# Patient Record
Sex: Male | Born: 1970 | Race: White | Hispanic: No | Marital: Single
Health system: Midwestern US, Community
[De-identification: ages and names within clinical notes are randomized; demographics above are authoritative.]

## PROBLEM LIST (undated history)

## (undated) DIAGNOSIS — K5190 Ulcerative colitis, unspecified, without complications: Secondary | ICD-10-CM

## (undated) HISTORY — PX: KNEE SURGERY: SHX244

## (undated) HISTORY — PX: BACK SURGERY: SHX140

## (undated) HISTORY — PX: ANKLE SURGERY: SHX546

## (undated) HISTORY — PX: PROSTATE SURGERY: SHX751

## (undated) HISTORY — PX: HERNIA REPAIR: SHX51

---

## 1999-04-27 NOTE — ED Provider Notes (Signed)
[facility]                      EMERGENCY DEPARTMENT TREATMENT REPORT   NAME:  [name], [name]   MR #:  [id number]   BILLING #: [id number]        DOS: 04/27/1999  TIME: 3:02 A   cc:   Primary Physician:  Gershon Mussel, M.D., Teresa Coombs   The patient was seen at 0250 hours.   CHIEF COMPLAINT:   Back pain.   HISTORY OF PRESENT ILLNESS:  The patient presents to the Emergency   Department stating that this past evening he was picking up a heavy duffel   bag when he injured his low back.  The patient is complaining of pain and   spasm in the low back.  The patient states the pain is radiating down the   right leg.  The patient also states that he is currently being followed by   Dr. Carlena Sax down at the [facility] in reference to his back pain.  The   patient was supposed to follow-up with [facility] and has yet   to do so.  The patient states he is possibly scheduled for epidural steroid   injections, but has not stated when exactly.  The patient denies any bowel   or bladder complaints.  The patient denies any other injuries or   complaints.    The patient states he has a herniated disk in his low back.   PAST MEDICAL HISTORY:   Pertinent for he states a herniated disk in his low   back, unsure of the level.  The patient has chronic low back pain, as well.   MEDICATIONS:  None.   ALLERGIES:  None.   SOCIAL HISTORY:   The patient smokes cigarettes, admits to ETOH use, denies   any ETOH this evening, denies any illicit drug use.   PHYSICAL EXAMINATION:   GENERAL:  The patient is a 28 year old white male, well-developed,   well-nourished, conscious, alert and oriented.  No respiratory distress.   VITAL SIGNS:  Blood pressure 153/71, pulse 106, respirations 18 and   temperature 97.4.   SKIN:  Warm and dry.  No lesions.   BACK:  Completely nontender to palpation.  There are no spasms palpable.   The patient, however, is slow to move and has guarded movements secondary    to his pain.  The patient has negative straight leg raising bilaterally.   EXTREMITIES:  Full range of motion, normal strength of all extremities.   Lower extremities have normal deep tendon reflexes bilaterally.   COURSE IN THE EMERGENCY DEPARTMENT:   The patient was seen and examined by   myself and Dr. Willette Pa.  The patient's condition remained stable   throughout his stay in the Emergency Department and had no other complaints   while in the Emergency Department.   FINAL DIAGNOSIS:  Recurrent low back pain.   DISPOSITION:  The patient was discharged home in stable condition with low   back pain after care instruction sheet.  The patient is given Vicodin ES,   #8 tablets only, with no refills and the patient understands he must see   Dr. Carlena Sax for any further pain medications.  He is also advised to use   Advil or Motrin for pain and use ice to the low back.  Return to the   Emergency Department for new or worsening symptoms or any other  concerns.   Electronically Signed By:   Truitt Merle, M.D. 04/30/1999 07:36   ____________________________   Truitt Merle, M.D.   jb D:  04/27/1999 T:  04/29/1999  2:14 A   [id number]   Juanetta Gosling, PA

## 2007-09-28 NOTE — Consults (Signed)
[facility]                               CONSULTATION REPORT                      CONSULTANT:  Rea College, M.D.   NAMERomeo [name], [name]   BILLING #:  [id number]              DATE OF CONSULT:     09/28/2007   MR #:       [id number]               ADM DATE:            09/28/2007   SS #        [id number]            PT. LOCATION:        [id number]               DOB:  [birth year]       AGE:  37             SEX:   M   ATTENDING:  Georgeanna Ayo, M.D.   cc:    Georgeanna Freshour, M.D.          Rea College, M.D.   REASON FOR CONSULTATION:  Right arm abscess.   CONSULTING PHYSICIAN:  Dr. Celine Mans.   HISTORY OF PRESENT ILLNESS:  [name] is a 37 year old gentleman who   presents to the emergency room with a 4-day history of  right arm swelling   and pain.  He denies any local trauma.  He has never had any similar   episodes in the past.  The abscess was drained in the emergency room, where   approximately a milliliter of purulent material was obtained.  He now   reports improvement in the swelling.   PAST MEDICAL HISTORY:  None.   PAST SURGICAL HISTORY:  Inguinal hernia repair and orthopedic procedures.   MEDICATIONS:  None.   ALLERGIES:  None.   PHYSICAL EXAMINATION:   VITAL SIGNS:  Temperature 100.4, vitals otherwise stable.   EXTREMITIES:  Right arm has decreased mobility due to pain.  There is a   2-cm laceration in the anteromedial aspect of the arm with surrounding   induration and erythema.  There is no further drainage.  He denies any   distal paresthesias.   LABORATORY DATA:  WBC 13.6 with a left shift.   RADIOLOGY:  CAT scan of the right upper extremity inflammatory cellulitis   with no organized drainable abscess.   ASSESSMENT:  Right arm abscess status post incision and drainage.  At this   point I would continue IV antibiotics with close monitoring.  It appears to   have improved since the drainage.  If his symptoms progress or his    leukocytosis persists, he may require further drainage in the operating   room.   Electronically Signed By:   Rea College, M.D. 10/06/2007 09:39   ____________________________   Rea College, M.D.   CD  D:  09/28/2007  T:  09/28/2007  6:18 P   [id number]

## 2007-09-28 NOTE — H&P (Signed)
[facility]                              HISTORY AND PHYSICAL                             [name], M.D.   NAMERomeo [name], [name]   MR #:    [id number]                  ADM DATE:          09/28/2007   BILLING  [id number]                 PT. LOCATION       [id number]   #:   SS #     [id number]               DOB:  1970-08-03   AGE:  37   [name], M.D.            SEX:  M   cc:    Mario Adsit, M.D.   HISTORY OF PRESENT ILLNESS:   The patient is a young Caucasian male who was admitted with a history of   pain and swelling in right arm area of 2-3 days.  The patient gives no   history of an injury.  He does not know how it started but it just started   all at one time, progressing to current condition in the ER, they had done   aspiration of an abscess under ultrasound.  CT scan showed no further   abscesses however, the patient's swelling is there.  I started him on   vancomycin and Zosyn pending further culture.   PAST MEDICAL HISTORY:   History of ulcerative colitis with flare-ups off and on.  The patient   seeked attention from the [facility] locally in [place].   SOCIAL HISTORY:   History of smoking.  Denies alcohol abuse and drug abuse.   ALLERGIES:   NO KNOWN DRUG ALLERGIES.   FAMILY HISTORY:   Unremarkable, lives with the family.   REVIEW OF SYSTEMS:   GENERAL:  Some history of low grade fever, no chills, no rigors.   HEAD:  No history of headache, head injury.   EYES:  No history of visual disturbances.   ENT:  No history of URI symptoms.   RESPIRATORY:  History of cough expectoration and wheezing for 2 days.   CVS:  There is no chest pain, palpitation.   GI:  No history of nausea, vomiting, diarrhea.   MUSCULOSKELETAL:  As mentioned in H&amp;P.   PHYSICAL EXAMINATION:   GENERAL:  Quite average-built male, alert, awake, oriented.   VITAL SIGNS:   Blood pressure 130/80, pulse 80 per minute, respiratory   20-22 per minute.    HEENT:  Head atraumatic, normocephalic.  Eyes clear bilaterally.   NECK:   No JVD, no thyromegaly.   CHEST:  Normal shape, bilaterally air entry present.  Bilateral extensive   rhonchi.   CVS:  S1-S2 regular.   ABDOMEN:  Benign.  Bowel sounds present.  No guarding or rigidity.   RECTAL:  Stool deferred.   LABORATORY DATA:   Sodium 140, potassium 4.3, chloride 103, bicarb 28, glucose 96, BUN 10,   creatinine 0.9, WBC 13.6, hemoglobin is 15.4, hematocrit 55.2.   IMPRESSION:  1. Right upper arm cellulitis.   2. Abscess right upper arm.   3. Chronic obstructive pulmonary disease versus acute bronchitis, acute.   4. Ulcerative colitis.   PLAN:   1. Right arm abscess formation - it could be an embolic abscess from his      lung.  I will get chest x-ray. The patient is having increasing cough      expectoration.  Meanwhile, continue broad-spectrum antibiotic. Repeat      MRI for further definition of any smaller abscesses, surgical      consultation.   2. Chronic obstructive pulmonary disease, acute, most likely secondary to      smoking.  It could be bronchitis acute versus pneumonia, pending chest      x-ray.  Depending on his condition, further treatment will change.   Electronically Signed By:   Mario Olvey, M.D. 10/24/2007 06:37   ____________________________   Mario Galeana, M.D.   AK  D:  09/28/2007  T:  09/28/2007 10:27 P   [id number]

## 2007-09-28 NOTE — ED Provider Notes (Signed)
[facility]                      EMERGENCY DEPARTMENT TREATMENT REPORT   NAME:  [name], [name], [name]  PT. LOCATION:      [id number]          DOB:                                                                         AGE:   MR #:      BILLING #:           DOA:  09/28/2007   DOD:              SEX:  M   [id number]   [id number]   cc:   CHIEF COMPLAINT:  Right swollen arm, possible bite.   HISTORY OF PRESENT ILLNESS:  This is a 37 year old male who presents with a   2-day history of pain and aching in his right biceps area.  He reports that   over the past 24 hours, it has become more edematous, more red, and   painful.  He denies fevers, sweats, or chills but has been nauseated this   morning but no localized abdominal pain.  He states the pain is radiating   from the right biceps area and radiating up into the right axilla, although   he does not complain of having streaking of redness in any direction.   REVIEW OF SYSTEMS:   CONSTITUTIONAL:  As per HPI.   INTEGUMENTARY:  As per HPI.   PAST MEDICAL HISTORY:  Negative.   MEDICATIONS:  None.   ALLERGIES:  None.   PHYSICAL EXAMINATION:   VITAL SIGNS:  Blood pressure is 116/74, pulse 93, respirations 20, and   temperature 99.  Pain 10 out of 10.   GENERAL:  This is a well-developed, well-nourished, well-appearing   37 year old male.   MUSCULOSKELETAL:  Examination of his right biceps area of the right arm   reveals a large area of erythema and soft tissue swelling indicative of   induration.  There are no areas of pointing or fluctuance present.  He is   extremely tender to palpation.  It is not draining from any area.  He has   full range of motion of the right shoulder, elbow, and hand but reports   that it is starting to feel stiff.  He has no lack of sensation but does   have slightly decreased strength .  There is no axillary lymphadenopathy or   cervical lymphadenopathy noted.    INITIAL ASSESSMENT:  After evaluation by Dr. Purnell Shoemaker, we decided   to use an ultrasound to ascertain if there is an abscess formation that we   can incise and drain today.  In the meantime, we will give the patient a   milligram of Dilaudid and 25 mg of Phenergan IM.   PROCEDURE NOTE:  Ultrasound done by Dr. Purnell Shoemaker.  The area of   redness and induration is ultrasounded today by Dr. Purnell Shoemaker, and   the ultrasound reveals a large area of purulent fluid under the   subcutaneous layer of the patient's biceps area of the right upper  extremity.  There is no tracking that we note.  No other abnormalities are   noted on the ultrasound.   SECOND PROCEDURE NOTE:  Incision and drainage done by Chucky May, PAC.   The patient is placed in the supine position.  Exposing the area of the   biceps of redness and induration, we cleansed the area with Betadine x3 and   sterile drapes are applied.  A few milliliters of 1% Xylocaine without   epinephrine is injected into the area of induration.  We inserted an   18-gauge needle into the area of induration to try to aspirate off some   fluid, and my first attempt was unsuccessful.  I then made a 1 cm by 1 cm   crosswise incision, and I am able to drain a small amount of purulent fluid   from the arm.  I used my blade to go down to the very base of the blade,   and I am still unable to extract any purulent fluid.  Dr. Purnell Shoemaker   inserts an 18-gauge needle into the area of induration and is able to also   extract a small amount of purulent fluid.  A 2.5 cm depth is where we are   able to extract the purulent drainage, and we are not able to aspirate more   than 1-2 mL.  The wound is then cleaned and packed with iodoform packing.   A gauze dressing is placed with Kerlix.   CONTINUATION BY:  NICOLE ARTHUR   COURSE IN THE ED:  After further evaluation by Dr. Laural Benes, we decided that   we will consult with general surgery for further evaluation of this    abscess.  It does appear to be significantly deep to the subcutaneous   tissue.  We will start him on IV antibiotics and IV pain medicine, and we   will call for admission to a regular bed and coverage by Dr. Celine Mans on call   for medicine today.  He is given another milligram of Dilaudid.  He is   given 1.5 g of vancomycin IV.   After some time, he still has 10/10 pain,   which he states is intractable.  So we administered 2 mg of IV morphine and   over the course of time, I have had to give him another 2 mg.  We obtained   a wound culture from the aspirate that Dr. Laural Benes was able to get.  We   then obtained a CBC and BMP.   Consultation with the general surgeon on   call reveals a request for a CT of the arm which is ordered.  Admission   will be completed by Dr. Laural Benes.   DISPOSITION:  We discharged the patient to admission to a regular bed under   the care of Dr. Celine Mans and the general surgeon on call for further   evaluation and management of this deep abscess.   FINAL DIAGNOSIS:   1. Primary deep abscess right bicep.   2. Cellulitis arm any part above the wrist.   Electronically Signed By:   Carlos American, M.D. 10/04/2007 11:26   ____________________________   Carlos American, M.D.   My signature above authenticates this document and my orders, the final   diagnosis(es), discharge prescription(s) and instructions in the [facility] record.   ST  D:  09/28/2007  T:  09/28/2007  4:46 P   [id number]   st  D:  09/28/2007  T:  09/29/2007 12:39 A   [id number]   Roxan Diesel, PA

## 2007-10-01 NOTE — Op Note (Signed)
[facility]                                OPERATION REPORT                        SURGEON:  JABULANI MUNALULA, M.D.   [name], [name]   E:   MR  [id number]                         DATE:            09/30/2007   #:   SS  [id number]                      PT. LOCATION:    [id number]   #   JABULANI MUNALULA, M.D.   cc:    JABULANI MUNALULA, M.D.   PREOPERATIVE DIAGNOSIS:   Right arm abscess.   POSTOPERATIVE DIAGNOSIS:   Right arm abscess.   PROCEDURE:   Incision and drainage of right arm abscess.   ANESTHESIA:   General.   ESTIMATED BLOOD LOSS:   None.   SURGEON:   J. Munalula, M.D.   URINE OUTPUT:   None.   PROCEDURE:  The patient was brought to the OR and placed on the table in a   supine position with his right arm extended at his side.  A general   anesthesia was induced.  His arm was prepped and draped.  I re-incised the   previous skin incision along the anterior aspect of the arm.  The incision   was vertical with a cruciate extension.  I obtained 5-10 cc of purulent   discharge.  I irrigated the wound copiously, debriding the nonviable tissue   from the abscess edges.  The cavity extended superiorly approximately 3 cm   and distally for a short distance.  Hemostasis was insured.  The wound was   packed with 1/4 inch Iodoform gauze with 4 x 4's and a Kerlix wrap.  The   patient was aroused and transferred to the recovery room in stable   condition.   Electronically Signed By:   JABULANI MUNALULA, M.D. 10/06/2007 09:39   ____________________________   JABULANI MUNALULA, M.D.   ECC  D:  10/01/2007  T:  10/02/2007 12:53 P   [id number]

## 2007-10-01 NOTE — Op Note (Signed)
[facility]                                OPERATION REPORT                        SURGEON:  Rea College, M.D.   [facility], [place]   E:   MR  [id number]                         DATE:            09/30/2007   #:   Lindley Magnus  [id number]                      PT. LOCATION:    [id number]   #   Rea College, M.D.   cc:    Rea College, M.D.   PREOPERATIVE DIAGNOSIS:   Right arm abscess.   POSTOPERATIVE DIAGNOSIS:   Right arm abscess.   PROCEDURE:   Incision and drainage of right arm abscess.   ANESTHESIA:   General.   ESTIMATED BLOOD LOSS:   None.   SURGEON:   Reyne Dumas, M.D.   URINE OUTPUT:   None.   PROCEDURE:  The patient was brought to the OR and placed on the table in a   supine position with his right arm extended at his side.  A general   anesthesia was induced.  His arm was prepped and draped.  I re-incised the   previous skin incision along the anterior aspect of the arm.  The incision   was vertical with a cruciate extension.  I obtained 5-10 cc of purulent   discharge.  I irrigated the wound copiously, debriding the nonviable tissue   from the abscess edges.  The cavity extended superiorly approximately 3 cm   and distally for a short distance.  Hemostasis was insured.  The wound was   packed with 1/4 inch Iodoform gauze with 4 x 4's and a Kerlix wrap.  The   patient was aroused and transferred to the recovery room in stable   condition.   Electronically Signed By:   Rea College, M.D. 10/06/2007 09:39   ____________________________   Rea College, M.D.   Seleta Rhymes  D:  10/01/2007  T:  10/02/2007 12:53 P   [id number]

## 2007-10-02 NOTE — Discharge Summary (Signed)
[facility]                                DISCHARGE SUMMARY                             [name], M.D.   NAMERomeo [name], [name], [name]   MR #:  [id number]                      ADM DATE:   09/28/2007   [name] [id number]                     DIS DATE:     10/02/2007   G#   SS #   [id number]                   DOB:  10-03-1970   [name], M.D.              AGE:37                   SEX:  M   cc:    [name], M.D.   HISTORY OF PRESENT ILLNESS AND HOSPITALIZATION:  This 37 year old Caucasian   male admitted to the emergency room at [facility] with a   history of right arm swelling pain and increased temperature.  Initially,   the ER physician tried to aspirate some pus, but later on MRI suggested   residual pus.  Dr. Adriana Reams, surgeon, did the surgery, I&amp;D.  Now, the   patient's pain is much better, still requiring some analgesic.  The patient   was changed to clindamycin p.o. for antibiotic coverage.  The wound grew   Streptococcus viridans but not very ______ is not available at this point.   The patient also has a history of smoking and he has some wheezing   suggestive of COPD.  I am adding Combivent inhaler p.[name].n.   DISCHARGE DIAGNOSES:   1.     Combivent 2 puff q.4 hours p.[name].n. for shortness of breath.   2.     Clindamycin 600 mg p.o. q.6 hours.   3.     Vicodin 5/500 p.o. q.6 hours for pain.   Follow up with Dr. Adriana Reams in 1 to 2 weeks or according to his schedule.   DIET:  Low-salt, low-cholesterol regular diet.   Strongly recommended to stop smoking.   Follow up with his primary care doctor in 2 weeks.   DISCHARGE DIAGNOSES:   1.     Abscess, right arm.   2.     Chronic obstructive pulmonary disease, acute.   3.     Tobacco abuse.   Electronically Signed By:   [name], M.D. 10/24/2007 06:37   ____________________________   [name], M.D.   TS1  D:  10/02/2007  T:  10/03/2007 12:58 P   [id number]

## 2007-10-04 NOTE — ED Provider Notes (Signed)
[facility]                      EMERGENCY DEPARTMENT TREATMENT REPORT   NAME:  Costilla, Dodge R, III  PT. LOCATION:      ER  [id number]          DOB:                                                                         AGE:   MR #:      BILLING #:           DOA:  10/04/2007   DOD:              SEXKara Dies   [id number]   cc:   Primary Physician:  Dr. Mordecai Maes   CHIEF COMPLAINT:   Cellulitis.   HISTORY OF PRESENT ILLNESS:  This is a 36-year gentleman, presenting to the   ER  last Saturday.  He was here for an I&amp;D to his left biceps due to an   abscess.  He actually up getting admitted.  He had to be taken to surgery   by Dr. Adriana Reams for surgical I&amp;D.  He was started on antibiotics and   discharged on Monday night at midnight.  He has been home taking the   antibiotic and using Vicodin.  He was seen in an urgent care facility today   for packing change.  He was told to change the packing every day until   followup with Dr. Adriana Reams.  The patient comes in tonight for significant   right arm pain.  He denies fever or chills.  No nausea or vomiting.   REVIEW OF SYSTEMS:   CONSTITUTIONAL:   Negative fever and chills.   GI:  Negative nausea and vomiting.   MUSCULOSKELETAL:  Complaining of right arm pain.   SKIN:  No purulent drainage or redness to the right arm.   Denies complaints in any other system.   PAST MEDICAL HISTORY:  Bronchitis.   MEDICATIONS:  Albuterol.   ALLERGIES:  None.   CURRENT MEDICATIONS:  Vicodin and clindamycin.   SOCIAL HISTORY:  The patient smokes approximately two packs of cigarettes a   day.  Denies any alcohol or street drugs.  He is here with wife.   PHYSICAL EXAMINATION:   VITAL SIGNS:  Blood pressure is 116/68, pulse 75, respirations 16.   Temperature is 97.8.  Pain is 10 out of 10.  O2 is 100% on room air.   GENERAL APPEARANCE:  The patient appears well developed and well nourished.    Appearance and behavior are age and situation appropriate.   MUSCULOSKELETAL:  The patient can flex and extend the right upper extremity   without any complications but did have secondary pain.  Radial pulse is   intact at 2+.  Capillary refill is prompt and less than 2 seconds.   SKIN:   There is an ulceration noted to the medial aspect of the right   biceps.  There is no purulent drainage.  There are no signs of cellulitis.   He does have tenderness but it is healing and appears well.  Packing was  removed.  No complications are noted at this time.   NEUROLOGIC:  He is alert and oriented.  He participates well in the exam.   Good eye contact.  Range of motion and strength good.   MUSCULOSKELETAL:  Grip strength is intact.   INITIAL ASSESSMENT AND MANAGEMENT PLAN:   We will go ahead and medicate   this patient for pain at this time.  Will remove packing and replace, but   at this time it appears noninfected.  He is to continue current treatment.   We will change his pain medication for more relief and give him other   instructions for wound care,  but otherwise the right arm is healing and   the surgical abscess incision site appears unremarkable.   EMERGENCY DEPARTMENT PROCEDURE:  The packing was removed.  The area had   1-1/4-inch iodine packing inserted without any complications and a clean   dressing was applied.   CLINICAL IMPRESSION AND DIAGNOSES:  Right arm pain status post surgical   abscess drainage.   DISPOSITION AND PLAN:   Discharged home in stable condition on Percocet   #20.  Increase fluids and rest with activity as tolerated.  Elevated the   right arm above the heart.  Apply moist heat to the area 2 or 3 times a day   for 10-15 minutes.  Percocet for pain, may make drowsy, be careful with   activity.  Return should symptoms worsen.  Motrin 600 mg p.o. t.i.d. with   food for pain and inflammation.  Follow up with surgeon as directed.  Get    packing changed daily as directed.  Continue antibiotics and stop Vicodin.   Dr. Arvella Merles saw this patient and agrees with the above assessment and plan.   Electronically Signed By:   Wetzel Bjornstad Arvella Merles, M.D. 10/05/2007 18:46   ____________________________   Wetzel Bjornstad. Arvella Merles, M.D.   My signature above authenticates this document and my orders, the final   diagnosis(es), discharge prescription(s) and instructions in the [facility] record.   LO  D:  10/05/2007  T:  10/05/2007  7:52 A   [id number]   LINDSAY COBURN, PA-C

## 2007-12-04 NOTE — ED Provider Notes (Signed)
[facility]                      EMERGENCY DEPARTMENT TREATMENT REPORT   NAME:  Feeley, Doak R, III  PT. LOCATION:      ER  ER59          DOB:                                                                         AGE:   MR #:      BILLING #:           DOA:  12/04/2007   DOD:              SEX:  M   36-68-21   [id number]   cc:    DR. Cherylynn Ridges   CHIEF COMPLAINT:  Cellulitis of the left arm.   HISTORY OF PRESENT ILLNESS:  This is a 37 year old male that noticed he had   a cyst on his left 2nd knuckle and he decided to go ahead and take a knife   and try to open it.  Now it is slightly painful and went to the [facility].  They told him he has cellulitis.  Apparently, does not have any   medication for this.  Has no fever.  No chills.  No nausea and no vomiting.   The hand is not swollen or red. He just states he has some pain to his left   hand when he moves his digit, his 2nd index finger, or when he moves his   index finger.  It does not bother him.   REVIEW OF SYSTEMS:   CONSTITUTIONAL: No fever, chills, or weight loss.   EYES: No visual symptoms.   ENT: No sore throat, runny nose, or other URI symptoms.   ENDOCRINE: No diabetic symptoms.   HEMATOLOGIC/LYMPHATIC: No excessive bruising or lymph node swelling.   ALLERGIC/IMMUNOLOGIC: No urticaria or allergy symptoms.   PAST MEDICAL HISTORY:  Asthma.   FAMILY HISTORY:  Negative.   SOCIAL HISTORY:  Negative.   MEDICATIONS:  Albuterol.   ALLERGIES:  None.   PHYSICAL EXAMINATION:   VITAL SIGNS:  Blood pressure 119/73, pulse 84, respirations 14, temperature   98.4.  On 0-10 pain scale, 10/10.  Saturation 98%.   GENERAL APPEARANCE:  Patient appears well developed and well nourished.   Appearance and behavior are age and situation appropriate.   EXTREMITIES:  Examination of the patient's left hand, he has a 1 cm   incision to his 2nd digit at the PIP joint.  He has no tenosynovitis.  He    has no redness or erythema.  Capillary refill is less than 3 seconds.  The   area to that he tried to excise appears to be a possible ganglion cyst or   an epidermal cyst.   COURSE IN THE EMERGENCY DEPARTMENT:  two Vicodin for the pain.  We will   treat him as an outpatient with Bactrim and Keflex.   CONTINUATION BY:  NELSON SANTIAGO   IMPRESSION:  We will place the patient on Keflex and Bactrim.   FINAL DIAGNOSIS:  Early cellulitis of the left hand.  DISPOSITION:  The patient is discharged with verbal and written   instructions and a referral for ongoing care.  The patient is aware that   they may return at any time for new or worsening symptoms.  Condition   stable.  Disposition is home.  Was sent home on pain medications as well.   Warm compresses.   Electronically Signed By:   Erlinda Hong, M.D. 12/07/2007 09:11   ____________________________   TODD Wilfrid Lund, M.D.   Dictated by:  Hilaria Ota, PA-C   My signature above authenticates this document and my orders, the final   diagnosis(es), discharge prescription(s) and instructions in the [facility] record.   ST  D:  12/04/2007  T:  12/06/2007 12:06 A   [id number]   st  D:  12/04/2007  T:  12/06/2007  7:28 P   [id number]

## 2008-03-15 NOTE — ED Provider Notes (Signed)
[facility]                      EMERGENCY DEPARTMENT TREATMENT REPORT   NAME:  [name], [name], [name]   PT. LOCATION:    ER  ER12       DOB:  [birth year]                                                                     AGE:  37   MR #:       BILLING #:           DOS: 03/15/2008  TIME: 7:05 P   SEX:  M   36-68-21    [id number]   cc:   Primary Physician:   CHIEF COMPLAINT:  Jaw pain.   HISTORY OF PRESENT ILLNESS:  This 37 year old male presents with jaw pain.   States he had a tooth extracted 2 weeks ago, and he has been having pain.   He saw the dentist 11 days ago and was placed on clindamycin and Percocet   and then saw him again yesterday; and he was told that he is waiting to see   if the skin heals up over his jaw and if not, that he would need surgery   next week; but no change was done in his management.  He came in today   because he is hurting a lot.   REVIEW OF SYSTEMS:   CONSTITUTIONAL:  No fever.   ENT:  Positive jaw pain.   PAST MEDICAL HISTORY:  Otherwise negative.   SOCIAL HISTORY:  Noncontributory.   PHYSICAL EXAMINATION:   VITAL SIGNS:  Blood pressure 133/79, pulse 98, respirations 14, and   temperature 99.8.   GENERAL APPEARANCE:  Patient appears well developed and well nourished.   Appearance and behavior are age and situation appropriate.  No facial edema   or erythema noted.  Bone visualized posterior jaw.  No abscess noted.   NECK:  Supple.  No mass palpable.   PROCEDURE NOTE:  Gingival block performed with 2% Xylocaine and 0.5%   bupivacaine with improvement of pain.   FINAL DIAGNOSIS:  Jaw pain.   PLAN:  Patient discharged.  Prescription for Percocet given and Lodine.  He   is to call his dentist tomorrow.  Patient evaluated by myself and Dr.   Henrene Hawking who agrees with the above assessment and plan.   Electronically Signed By:   Haze Justin, M.D. 04/19/2008 09:39   ____________________________   Haze Justin, M.D.   Dictated by:  Dorise Hiss    My signature above authenticates this document and my orders, the final   diagnosis(es), discharge prescription(s) and instructions in the [facility] record.   ST  D:  03/15/2008  T:  03/17/2008  9:16 A   [id number]

## 2008-04-15 LAB — DRUG SCREEN, URINE
ACETAMINOPHEN: NEGATIVE
AMPHETAMINES: NEGATIVE
BARBITURATES: NEGATIVE
BENZODIAZEPINES: POSITIVE — AB
COCAINE: NEGATIVE
METHADONE: NEGATIVE
Methamphetamines: NEGATIVE
OPIATES: POSITIVE — AB
PCP(PHENCYCLIDINE): NEGATIVE
THC (TH-CANNABINOL): NEGATIVE
TRICYCLICS: NEGATIVE

## 2008-04-15 LAB — URINALYSIS W/ RFLX MICROSCOPIC
Bilirubin UA, confirm: NEGATIVE
Bilirubin: NEGATIVE
Blood: NEGATIVE
Glucose: NEGATIVE MG/DL
Leukocyte Esterase: NEGATIVE
Nitrites: NEGATIVE
Specific gravity: 1.02 (ref 1.003–1.030)
Urobilinogen: 0.2 EU/DL (ref 0.2–1.0)
pH (UA): 6 (ref 5.0–8.0)

## 2008-04-15 LAB — URINE MICROSCOPIC ONLY: WBC: 1 /HPF (ref 0–4)
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
## 2008-04-29 NOTE — Procedures (Signed)
Test Reason : PRE OP   Blood Pressure : ***/*** mmHG   Vent. Rate : 076 BPM     Atrial Rate : 076 BPM   P-R Int : 138 ms          QRS Dur : 092 ms   QT Int : 378 ms       P-R-T Axes : 023 095 046 degrees   QTc Int : 425 ms   Normal sinus rhythm   Rightward axis   Incomplete right bundle branch block   Borderline ECG   No previous ECGs available   Confirmed by Robbins, M.D., Joseph ([id number]) on 04/29/2008 3:41:40 PM   Referred By:             Overread By: Joseph Robbins, M.D.

## 2008-04-29 NOTE — Procedures (Signed)
Test Reason : PRE OP   Blood Pressure : ***/*** mmHG   Vent. Rate : 076 BPM     Atrial Rate : 076 BPM   P-R Int : 138 ms          QRS Dur : 092 ms   QT Int : 378 ms       P-R-T Axes : 023 095 046 degrees   QTc Int : 425 ms   Normal sinus rhythm   Rightward axis   Incomplete right bundle branch block   Borderline ECG   No previous ECGs available   Confirmed by Sherral Hammers, M.D., Jomarie Longs [phone]) on 04/29/2008 3:41:40 PM   Referred By:             Overread By: Jani Files, M.D.
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
## 2008-04-30 NOTE — H&P (Signed)
[facility]                              HISTORY AND PHYSICAL                               Jolyn Nap, MD   NAME:    [name], [name]   MR #:    [id number]                  ADM DATE:          05/10/2008   BILLING  [id number]                 PT. LOCATION       [id number]   #:   SS #     [id number]               DOB:  [birth year]   AGE:  37   Mario Trudee Kuster, MD                 SEX:  M   cc:    [name], M.D.          Jolyn Nap, MD   HISTORY   The patient is a 37 year old white male who is 11 days postop from an L5-S1   laminectomy and diskectomy at [facility].  Over the last   week he has been developing progressive pain and then drainage from the   dorsal aspect of his postsurgical wound at the lumbosacral region.  He   denies fevers and chills.  His pain is restricted primarily to his back.   He was invited to return to the outpatient office so his surgeon could   evaluate him, and he failed to do so, but instead went to his local   hospital.  The patient lives at the [address] of [place] and has   had transportation problems getting to the [place] area where   he had his surgery.  The [facility] cultured his   wound either on Tuesday or Wednesday.  We still do not have that culture   result.  The patient then presented to the hospital emergency room at   [facility] on the evening of the 27th of November, and an MRI was   performed.  This demonstrated a small epidural abscess measuring   approximately 1 cm in size, perhaps even slightly smaller.  The patient is   admitted the hospital for treatment of his epidural abscess and his pain   control.   PAST MEDICAL HISTORY AND REVIEW OF SYSTEMS   The patient has an allergy to Mobic.  Otherwise he has no other medical   allergies.  Medical conditions include:   1. Ulcerative colitis.   2. Right upper arm abscess in April of 2009.   3. COPD.    4. [facility] spotted fever in January 2009 treated with two unknown      antibiotics.   5. Jaw surgery in September 2009 in which a tooth was pulled and failed to      heal requiring some type of oral surgery in October 2009 by Dr. Rhys Martini      from the [place] [facility] in order to cover the exposed jaw      bone.  6. Lumbosacral spine surgery on April 30, 2008, L5-S1 laminectomy and      diskectomy.   The patient states that he has not been seen or evaluated for ulcerative   colitis for at least 4 years and that is abdominal symptoms have subsided.   His smoking history - He used to smoke three packs of cigarettes per day.   He began smoking at age 41.  He has cut back to one pack of cigarettes per   day effective about July 2009.  Alcohol intake - Used to be very heavy.  He   claims to have stopped consuming alcohol completely 9 years ago, but began   drinking at about age 76, consuming at least one case of beer per day,   sometimes that plus a fifth of hard alcohol per day.   MEDICATIONS   The patient's COPD has been treated with p.r.n. Combivent.  No other   regular medications.   The patient has been treated with a chronic pain management team, but was   apparently discharged from that management as a result of his narcotic use   during the surgery for his jaw in about October 2009.  He is scheduled to   be reevaluated by a new pain management team under the direction of Dr.   Carolann Littler at the [facility] sometime in last   December 2009.   PHYSICAL EXAMINATION TODAY   The patient is awake, alert, responsive and has appropriate conversation.   NEUROLOGIC:  His neurologic examination of the lower extremities is   normal.   HEAD AND NECK:  Exam is unremarkable.   LUNGS:  Clear to auscultation.   HEART:  Regular rate rhythm with no audible murmurs or gallops.   ABDOMEN:  Benign.   LOWER EXTREMITIES:  His neurosurgical examination is intact.  At the wound    in the lumbosacral spine area posteriorly he has approximately 3 or 4 cm in   length with slight drainage from the most proximal portion of the wound.   There is no erythema in the back.  He has some mild tenderness to palpation   at the left paraspinal area adjacent to the wound.   We reviewed the MRI of the lumbosacral spine done on November 27th, which   was yesterday, and see this very small abscess.   PLAN   1. The patient is admitted to the hospital for a combination of IV      antibiotic treatment and pain management treatment.   2. We have contacted the infectious disease consultant, Dr. Edison Simon,      and discussed a game plan for this patient.  We are attempting to obtain      the cultures from the [facility] both for from his lumbosacral spine      wound culture that was taken in the [facility] earlier this week and a      culture that was obtained from our own emergency room on November 27th.      Furthermore, we will ask the [place] to send [place] any information they      have concerning his culture of the mouth if that was taken during the      September, October time frame as his jaw surgery was being done.  We      reviewed the culture results from his right arm abscess that was      addressed at [facility]  between the 16th and the 10th      of April 2009.  His gram stain showed gram-negative bacilli, moderate,      gram-positive bacilli, a few, and he had grown betahemolytic strep,      group C, strep viridans, modified strep intermedius and moderate      Prevotella species (Bacteroides).   3. We discussed with the radiology department the potential for doing a      radiology-directed aspiration of the abscess.  However, apparently it is      too small really to do an x-ray/CT-directed needle aspiration.      Therefore, we are starting antibiotics as recommended by infectious      disease which will be vancomycin and doripenem.  We will discontinue the      Cleocin for now.    Electronically Signed By:   Jolyn Nap, MD 05/12/2008 17:49   ____________________________   Jolyn Nap, MD   SC  D:  05/11/2008  T:  05/11/2008  1:49 P   [id number]

## 2008-04-30 NOTE — Op Note (Signed)
[facility]                                OPERATION REPORT                          SURGEON:  [name], M.D.   [facility], [place]   E:   MR  [id number]                DATE OF SURGERY:                     04/30/2008   #:   [name]  [id number]             PT. LOCATION:                        [id number]   #   [name], M.D.          DOB: [birth year]        AGE:37        SEX:  M   cc:    [name], M.D.   PREOPERATIVE DIAGNOSIS:   Lumbar disk herniation L5-S1 with stenosis.   POSTOPERATIVE DIAGNOSIS:   Lumbar disk herniation L5-S1 with stenosis.   PROCEDURE PERFORMED:   Lumbar laminectomy and decompression, L5-S1.   SURGEON:   Lyna Poser, M.D.   ASSISTANT:   Lauro Franklin, PA-C   ANESTHESIA:   General endotracheal   BLOOD LOSS:   100 cc   FLUIDS RECEIVED:   Crystalloid   DRAINS:   One suction drain   COMPLICATIONS:   None   HISTORY:   This patient is a 37 year old otherwise healthy gentleman who presented to   me with ongoing complaints of lower back discomfort and sciatica.   Preoperative imaging studies confirmed lumbar pathology at L5-S1.  The   surgical and nonsurgical options were explored.  The nonsurgical options   failed to alleviate his symptoms.  The decision was made to proceed with   operative management.   DESCRIPTION OF PROCEDURE:   On the surgical date, the patient was identified properly in the same day   admission unit area.  The surgical site was identified and marked   appropriately.  The surgical procedure was further discussed with the   patient and the decision made to proceed.  Intravenous antibiotics were   initiated in the holding area.  The patient was brought to the operative   theater.  A general anesthetic was performed by the anesthesiologist.  The   patient was placed prone on the [place] frame.  All bony prominences were   well padded.  The patient's lumbar area was prepped in the usual fashion   and draped appropriately.    A longitudinal incision was made with a sharp blade in the midline at the   level of the lumbar spine.  Using Bovie electrocautery and sharp   dissection, superficial tissues and midline musculature were split   longitudinally and retracted with self-retaining retractors.  Dissection   was carried down to the posterior elements at L5-S1.  Using a Leksell   rongeur, the spinous processes were removed.  Curettes, Leksell rongeurs,   pituitaries, etc. were used to create a full central, lateral recess and   foraminal decompression at L5-S1.  The dura was visualized subsequent to   the  decompression and found be free from further neurologic impingement.  A   Woodson was used to palpate the patency of the neural foramina   bilaterally.   The wound was then irrigated and Gelfoam was placed over the exposed dura.   The wound was closed in layers.  A suction drain was placed.  Running   Monocryl suture was used for the skin.  A sterile dressing was applied and   the patient was subsequently extubated and taken to the recovery room in   stable condition without complications.  All counts were correct.   ____________________________   [name], M.D.   ecc  D:  04/30/2008  T:  05/01/2008  9:24 A  [id number]

## 2008-04-30 NOTE — Op Note (Signed)
[facility]                                OPERATION REPORT                          SURGEON:  [name], M.D.   [facility], [place]   E:   MR  [id number]                DATE OF SURGERY:                     04/30/2008   #:   [name]  [id number]             PT. LOCATION:                        [id number]   #   [name], M.D.          DOB: [birth year]        AGE:37        SEX:  M   cc:    [name], M.D.   PREOPERATIVE DIAGNOSIS:   Lumbar disk herniation L5-S1 with stenosis.   POSTOPERATIVE DIAGNOSIS:   Lumbar disk herniation L5-S1 with stenosis.   PROCEDURE PERFORMED:   Lumbar laminectomy and decompression, L5-S1.   SURGEON:   Lyna Poser, M.D.   ASSISTANT:   Lauro Franklin, PA-C   ANESTHESIA:   General endotracheal   BLOOD LOSS:   100 cc   FLUIDS RECEIVED:   Crystalloid   DRAINS:   One suction drain   COMPLICATIONS:   None   HISTORY:   This patient is a 37 year old otherwise healthy gentleman who presented to   me with ongoing complaints of lower back discomfort and sciatica.   Preoperative imaging studies confirmed lumbar pathology at L5-S1.  The   surgical and nonsurgical options were explored.  The nonsurgical options   failed to alleviate his symptoms.  The decision was made to proceed with   operative management.   DESCRIPTION OF PROCEDURE:   On the surgical date, the patient was identified properly in the same day   admission unit area.  The surgical site was identified and marked   appropriately.  The surgical procedure was further discussed with the   patient and the decision made to proceed.  Intravenous antibiotics were   initiated in the holding area.  The patient was brought to the operative   theater.  A general anesthetic was performed by the anesthesiologist.  The   patient was placed prone on the [place] frame.  All bony prominences were   well padded.  The patient's lumbar area was prepped in the usual fashion   and draped appropriately.    A longitudinal incision was made with a sharp blade in the midline at the   level of the lumbar spine.  Using Bovie electrocautery and sharp   dissection, superficial tissues and midline musculature were split   longitudinally and retracted with self-retaining retractors.  Dissection   was carried down to the posterior elements at L5-S1.  Using a Leksell   rongeur, the spinous processes were removed.  Curettes, Leksell rongeurs,   pituitaries, etc. were used to create a full central, lateral recess and   foraminal decompression at L5-S1.  The dura was visualized subsequent to   the  decompression and found be free from further neurologic impingement.  A   Woodson was used to palpate the patency of the neural foramina   bilaterally.   The wound was then irrigated and Gelfoam was placed over the exposed dura.   The wound was closed in layers.  A suction drain was placed.  Running   Monocryl suture was used for the skin.  A sterile dressing was applied and   the patient was subsequently extubated and taken to the recovery room in   stable condition without complications.  All counts were correct.   ____________________________   [name], M.D.   ecc  D:  04/30/2008  T:  05/01/2008  9:24 A  [id number]

## 2008-04-30 NOTE — Op Note (Signed)
[facility]                                OPERATION REPORT                          SURGEON:  [name], M.D.   [facility], [name]   E:   MR  [id number]                DATE OF SURGERY:                     04/30/2008   #:   [name]  [id number]             PT. LOCATION:                        [id number]   #   [name], M.D.          DOB: 03-Sep-1970        AGE:37        SEX:  M   cc:    [name], M.D.   PREOPERATIVE DIAGNOSIS:   Lumbar disk herniation L5-S1 with stenosis.   POSTOPERATIVE DIAGNOSIS:   Lumbar disk herniation L5-S1 with stenosis.   PROCEDURE PERFORMED:   Lumbar laminectomy and decompression, L5-S1.   SURGEON:   Lyna Poser, M.D.   ASSISTANT:   Lauro Franklin, PA-C   ANESTHESIA:   General endotracheal   BLOOD LOSS:   100 cc   FLUIDS RECEIVED:   Crystalloid   DRAINS:   One suction drain   COMPLICATIONS:   None   HISTORY:   This patient is a 37 year old otherwise healthy gentleman who presented to   me with ongoing complaints of lower back discomfort and sciatica.   Preoperative imaging studies confirmed lumbar pathology at L5-S1.  The   surgical and nonsurgical options were explored.  The nonsurgical options   failed to alleviate his symptoms.  The decision was made to proceed with   operative management.   DESCRIPTION OF PROCEDURE:   On the surgical date, the patient was identified properly in the same day   admission unit area.  The surgical site was identified and marked   appropriately.  The surgical procedure was further discussed with the   patient and the decision made to proceed.  Intravenous antibiotics were   initiated in the holding area.  The patient was brought to the operative   theater.  A general anesthetic was performed by the anesthesiologist.  The   patient was placed prone on the [place] frame.  All bony prominences were   well padded.  The patient's lumbar area was prepped in the usual fashion   and draped appropriately.   A longitudinal incision was made with a  sharp blade in the midline at the   level of the lumbar spine.  Using Bovie electrocautery and sharp   dissection, superficial tissues and midline musculature were split   longitudinally and retracted with self-retaining retractors.  Dissection   was carried down to the posterior elements at L5-S1.  Using a Leksell   rongeur, the spinous processes were removed.  Curettes, Leksell rongeurs,   pituitaries, etc. were used to create a full central, lateral recess and   foraminal decompression at L5-S1.  The dura was visualized subsequent to   the  decompression and found be free from further neurologic impingement.  A   Woodson was used to palpate the patency of the neural foramina   bilaterally.   The wound was then irrigated and Gelfoam was placed over the exposed dura.   The wound was closed in layers.  A suction drain was placed.  Running   Monocryl suture was used for the skin.  A sterile dressing was applied and   the patient was subsequently extubated and taken to the recovery room in   stable condition without complications.  All counts were correct.   ____________________________   [name], M.D.   ecc  D:  04/30/2008  T:  05/01/2008  9:24 A  [id number]

## 2008-04-30 NOTE — Op Note (Signed)
[facility]                                OPERATION REPORT                          SURGEON:  [name], M.D.   [facility], [place]   E:   MR  [id number]                DATE OF SURGERY:                     04/30/2008   #:   Lindley Magnus  [id number]             PT. LOCATION:                        [id number]   #   [name], M.D.          DOB: [birth year]        AGE:37        SEX:  M   cc:    [name], M.D.   PREOPERATIVE DIAGNOSIS:   Lumbar disk herniation L5-S1 with stenosis.   POSTOPERATIVE DIAGNOSIS:   Lumbar disk herniation L5-S1 with stenosis.   PROCEDURE PERFORMED:   Lumbar laminectomy and decompression, L5-S1.   SURGEON:   Lyna Poser, M.D.   ASSISTANT:   Lauro Franklin, PA-C   ANESTHESIA:   General endotracheal   BLOOD LOSS:   100 cc   FLUIDS RECEIVED:   Crystalloid   DRAINS:   One suction drain   COMPLICATIONS:   None   HISTORY:   This patient is a 37 year old otherwise healthy gentleman who presented to   me with ongoing complaints of lower back discomfort and sciatica.   Preoperative imaging studies confirmed lumbar pathology at L5-S1.  The   surgical and nonsurgical options were explored.  The nonsurgical options   failed to alleviate his symptoms.  The decision was made to proceed with   operative management.   DESCRIPTION OF PROCEDURE:   On the surgical date, the patient was identified properly in the same day   admission unit area.  The surgical site was identified and marked   appropriately.  The surgical procedure was further discussed with the   patient and the decision made to proceed.  Intravenous antibiotics were   initiated in the holding area.  The patient was brought to the operative   theater.  A general anesthetic was performed by the anesthesiologist.  The   patient was placed prone on the [name] frame.  All bony prominences were   well padded.  The patient's lumbar area was prepped in the usual fashion   and draped appropriately.   A longitudinal incision was made with a  sharp blade in the midline at the   level of the lumbar spine.  Using Bovie electrocautery and sharp   dissection, superficial tissues and midline musculature were split   longitudinally and retracted with self-retaining retractors.  Dissection   was carried down to the posterior elements at L5-S1.  Using a Leksell   rongeur, the spinous processes were removed.  Curettes, Leksell rongeurs,   pituitaries, etc. were used to create a full central, lateral recess and   foraminal decompression at L5-S1.  The dura was visualized subsequent to   the  decompression and found be free from further neurologic impingement.  A   Woodson was used to palpate the patency of the neural foramina   bilaterally.   The wound was then irrigated and Gelfoam was placed over the exposed dura.   The wound was closed in layers.  A suction drain was placed.  Running   Monocryl suture was used for the skin.  A sterile dressing was applied and   the patient was subsequently extubated and taken to the recovery room in   stable condition without complications.  All counts were correct.   ____________________________   [name], M.D.   ecc  D:  04/30/2008  T:  05/01/2008  9:24 A  [id number]

## 2008-05-03 NOTE — ED Provider Notes (Signed)
[facility]                      EMERGENCY DEPARTMENT TREATMENT REPORT   NAME:  [name], [name], [name]   PT. LOCATION:    ER  ER30       DOB:  [birth year]                                                                     AGE:  37   MR #:       BILLING #:           DOS: 05/03/2008  TIME: 2:29 P   SEX:  M   [id number]    [id number]   cc:   Primary Physician:   CHIEF COMPLAINTBack pain.   HISTORY OF PRESENT ILLNESS   This is a 37 year old male who is postop day 3 from lumbar laminectomy and   decompression L5-S1.  He  complains of pain.  He states that he has had   pain since the operation.  He has not really gotten any worse but he has   been unable to control it at home.  He is on Percocet and ibuprofen and   feels as though the pain is not getting any better.  He was seen by Dr.   Renaldo Aldridge who referred him to pain management.  He was unable to get in and   therefore he presents here.   REVIEW OF SYSTEMS   CONSTITUTIONAL:  No fever, chills, weight loss.  His significant other with   him reports that he was diaphoretic last night, but the patient denies any   fever.   EYES: No visual symptoms.   ENT: No sore throat, runny nose or other URI symptoms.   RESPIRATORY:  No cough, shortness of breath, or wheezing.   CARDIOVASCULAR:  No chest pain, chest pressure, or palpitations.   MUSCULOSKELETAL:  Back pain as per HPI.   NEUROLOGIC:  The patient reports that he occasionally gets numbness when he   is lying on his stomach trying to get comfortable down his lower   extremities, but he states that this resolved with position change.  He   reports that he is able to walk without difficulty and just "tires out   easily."   PAST MEDICAL HISTORY   History of ulcerative colitis.   MEDICATIONS   Percocet.   ALLERGIESNone.   SOCIAL HISTORY   She smokes tobacco.  She denies alcohol or drug abuse.   FAMILY HISTORY   Noncontributory.   PHYSICAL EXAMINATION    VITAL SIGNS:  Temperature is 98.3, blood pressure 119/75, pulse 102,   respiratory rate of 18.   GENERAL:  Well-developed male in distress secondary to pain.   HEENT:  Eyes:  Conjunctivae clear, lids normal.  Pupils equal, symmetrical,   and normally reactive.  Mouth/Throat:  Surface of tongue appears dry.   NECK:  Supple.   LUNGS:  There are some scattered end-expiratory wheezes.   CARDIOVASCULAR:  Heart is tachycardiac and regular.   GASTROINTESTINAL:  Abdomen is soft.   BACK:  There is an incision which is clean, dry and intact.  There is a   very small amount of erythema around  the suture line.  He is mainly   exquisitely tender just left of the incision.  There are no skin changes   here.  There is 5/5 strength in the lower extremities.  Sensation is   intact.   INITIAL ASSESSMENT AND MANAGEMENT PLAN   This is a gentleman with back pain postoperatively.  I will check labs and   discuss with Dr. Renaldo Koehl.  He is neurologically intact at this time.   DIAGNOSTIC STUDIES   CBC is normal except for hemoglobin of 12.6.  BMP is normal except for   glucose of 158.   EMERGENCY DEPARTMENT COURSE   The patient was treated with Dilaudid IV and IV fluids.  I spoke with Dr.   Renaldo Pascuzzi.  There is concern for narcotic abuse with this patient.  However, he   does wish to see the patient today in his clinic.  I discharged him out to   follow up with him this afternoon.  The patient is in agreement with plan.   CLINICAL IMPRESSION/DIAGNOSIS   Postoperative pain.   DISPOSITION   The patient was discharged home in stable condition.  Return for new or   worsening symptoms, difficulty ambulating, numbness or weakness in   extremities, or any other concerns whatsoever.  hat   Electronically Signed By:   Acquanetta Chain, M.D. 05/21/2008 11:32   ____________________________   Acquanetta Chain, M.D.   My signature above authenticates this document and my orders, the final   diagnosis(es), discharge prescription(s) and instructions in the [facility] record.   MW  D:  05/03/2008  T:  05/04/2008 12:03 P   [id number]/

## 2008-05-10 NOTE — ED Provider Notes (Signed)
[name]                      EMERGENCY DEPARTMENT TREATMENT REPORT   NAME:  Burgard, [name]          PT. LOCATION:    ER  ER58       DOB:  [birth year]                                                                     AGE:  37   MR #:       BILLING #:           DOS: 05/10/2008  TIME: 7:48 P   SEX:  M   [id number]    [id number]   cc:   Primary Physician:   CHIEF COMPLAINT:  Drainage, post laminectomy on 04/30/08.   HISTORY OF CHIEF COMPLAINT:  This is a pleasant 37 year old male coming in   with the complaint of drainage at his surgery site.  It has been draining   now for the past 4 days.  It has been hurting.  He is complaining of pain   that is like burning inside of his spine.  He has been to the emergency   room twice now, once here and once at [facility].  He had an x-ray that   was negative and, he says, a culture that was negative.  The patient was   concerned because of the burning sensation, and he brought himself in for   further evaluation and treatment.  He states it is burning in the center   part of his spine, radiating down his right leg.  He denies any loss of   bowel control, urinary incontinence, or loss of ambulation.  He states this   just feels like it is burning with mild amount of oozing that is coming   out.  He was concerned and immediately brought himself in for further   evaluation and treatment.  He denies any fever.  He denies any nausea or   vomiting.  He denies any other complaints.   REVIEW OF SYSTEMS:   CONSTITUTIONAL: No fever, chills, or weight loss.   EYES: No visual symptoms.   RESPIRATORY: No cough, shortness of breath, or wheezing.   CARDIOVASCULAR: No chest pain, chest pressure, or palpitations.   GASTROINTESTINAL: No vomiting, diarrhea, or abdominal pain.   MUSCULOSKELETAL:  Back pain, burning in spine and down right leg.   SKIN:  Dehiscence noted at the top of the surgical site, over the L4-L5   area.  Opening slightly is noted.    PAST MEDICAL HISTORY:  History of recent laminectomy, 04/30/08.  History of   ulcerative colitis.   SOCIAL HISTORY:  He denies alcohol or tobacco use.   ALLERGIES:  Mobic.   PHYSICAL EXAMINATION:   VITAL SIGNS:   BP is 117/71, pulse 89, respirations 14, and temperature   97.3.  Pain is 10/10.   GENERAL APPEARANCE:  Patient appears well developed and well nourished.   Appearance and behavior are age and situation appropriate.   HEENT:  Eyes:  Conjunctivae clear, lids normal.  Pupils equal, symmetrical,   and normally reactive.   NECK:  Supple, nontender, symmetrical, no  masses or JVD, trachea midline.   Thyroid not enlarged, nodular, or tender.   LYMPHATICS:  No cervical or submandibular lymphadenopathy palpated.   RESPIRATORY:  Clear and equal breath sounds.  No respiratory distress,   tachypnea, or accessory muscle use.   HEART:  Regular rate and rhythm.  No murmur.   GI:  Abdomen soft, nontender, without complaint of pain to palpation.  No   hepatomegaly or splenomegaly.  No abdominal or inguinal masses appreciated   by inspection or palpation.   SKIN:  The patient has an incision, approximately 5 cm, with the top   portion of 1.5 cm starting to open up/dehisce.  Cannot appreciate how deep,   but there is a whitening ooze around it.  We will culture and send.  Very   tender.  Erythematous around the site.  No streaking noted, other than 1 cm   outside of the incision.   CONTINUATION BY DR. MANOLIO   DIAGNOSTIC INTERPRETATION   Urinalysis shows specific gravity greater than 1.030 but is otherwise   negative.  CBC and CMP are normal.  MRI of L-spine shows "small epidural   abscess cannot be excluded in the posterior spinal canal at the L5-S1 level   with only mild impression on the thecal sac and maximum dimension 16 mm.   COURSE IN THE EMERGENCY DEPARTMENT   I spoke with Dr. Richardo Priest for Dr. Renaldo Bancroft, who asked me to place the patient on   Cleocin 600 mg IV q.6 hours, a Dilaudid PCA pump, obtain an ID consult in    the morning, and give him an overhead trapeze and sequential compression   stockings.  I have notified the patient of same, he is comfortable with   admission at this point.   DIAGNOSES   1. Small epidural abscess status post laminectomy 04/30/2008.   2. Chronic pain syndrome.   DISPOSITION   The patient admitted in stable condition to the floor.   Electronically Signed By:   Imogene Burn, M.D. 05/17/2008 11:02   ____________________________   Imogene Burn, M.D.   My signature above authenticates this document and my orders, the final   diagnosis(es), discharge prescription(s) and instructions in the [facility] record.   DH  D:  05/10/2008  T:  05/10/2008 10:07 P   [id number]/   st  D:  05/10/2008  T:  05/12/2008  5:37 A   [id number]

## 2008-05-17 NOTE — Discharge Summary (Signed)
[facility]                                DISCHARGE SUMMARY                               [name], M.D.   NAMERomeo [name], [name]   MR #:  [id number]                      ADM DATE:   05/10/2008   [name] [id number]                     DIS DATE:     05/14/2008   G#   SS #   [id number]                   DOB:  [birth year]   [name], M.D.                  AGE:37                   SEX:  M   cc:    [name], M.D.   REASON FOR ADMISSION   Low back pain with question of lumbar infection.   HISTORY AND HOSPITAL COURSE   The patient is a 37 year old gentleman who underwent lumbar laminectomy and   decompression done by myself several weeks ago.  Because of ongoing   discomfort, he was admitted to the hospital for evaluation and management.   Wound cultures confirmed a lumbar wound infection, cultures positive for   Pseudomonas aeruginosa, Enterobacter, etc.   Infectious Disease consultation was obtained and the patient was eventually   placed on Levaquin, doripenem and vancomycin.  A PICC line was placed and a   home antibiotic regimen set up for him.   DISCHARGE INSTRUCTIONS   The patient will continue antibiotics per infectious disease   recommendation.  He will follow up with me in the outpatient clinic in 2   weeks for wound check.   Pain medication will be handled via his pain management physician in [place].  However, as a temporizing measure, a prescription for Dilaudid 8   mg tablets was given to him, to take until he can see his pain management   doctor.   Discharge IV antibiotic prescriptions are via the ID service.   ____________________________   [name], M.D.   le  D:  05/17/2008  T:  05/17/2008  4:26 P   [id number]

## 2008-07-04 NOTE — ED Provider Notes (Signed)
[facility]                      EMERGENCY DEPARTMENT TREATMENT REPORT   NAME:  [name], [name]                  PT. LOCATION:     ER  [id number]   MR #:         BILLING #: [id number]          DOS: 07/03/2008   ZSWF:09:32 A   [id number]   cc:    Cherylynn Ridges, M.D.   PRIMARY CARE PHYSICIAN   Cherylynn Ridges, M.D.   TIME [phone]   CHIEF COMPLAINT   Cellulitis, blood clot.   HISTORY OF PRESENT ILLNESS   A 38 year old male presents to the emergency department for evaluation of a   fever up to 104 last night, pain in his right arm, and a blood clot.  The   patient states that he underwent a laminectomy by Dr. Renaldo Engelstad on 04/30/08.   He was admitted to the hospital on 05/10/08 after he was noted to have   drainage from his surgical site.  He was discharged from the hospital on   12/4.  He had a PICC line placed and was discharged home on antibiotics.   The patient states that he ended developing an infection at that IV site.   He was admitted to [name] in December and states that he was on   various antibiotics.  He also developed a blood clot in his right arm and   was on Lovenox and Coumadin.  Since then, he has been discharged to [name], which is where he lives.  He states that he was having   trouble finding an infectious disease doctor to follow up with.  He did   make an appointment with a pain management doctor on 1/30.  He comes in   tonight because he is concerned about the persistent fever despite being on   p.o. Keflex and clindamycin at this time.  He also states that he is   running low on his Coumadin.  He has not followed up for any recent checks   of his INR.  He states that he has gone to the [name] for   various complaints throughout this time as well.   REVIEW OF SYSTEMS   CONSTITUTIONAL:  Fever by history.   EYES: No visual symptoms.   ENT: No sore throat, runny nose or other URI symptoms.    RESPIRATORY:  Chronic cough secondary to smoking, which he notes to be   unchanged.   CARDIOVASCULAR:  No chest pain, chest pressure, or palpitations.   GASTROINTESTINAL:  No vomiting, diarrhea, or abdominal pain.   GENITOURINARY:  No dysuria, frequency, or urgency.   MUSCULOSKELETAL:  Right arm pain, back pain.   INTEGUMENTARY:  No rashes.   NEUROLOGICAL:  No headaches, sensory or motor symptoms.   PSYCHIATRIC:  No suicidal or homicidal ideation.   PAST MEDICAL HISTORY   Includes laminectomy by Dr. Renaldo Escalera in November 2009, ulcerative colitis,   right upper arm abscess in April 2009, COPD, [name] spotted fever   in January 2009, treated with 2 antibiotics at that time, jaw surgery.   ALLERGIES   Mobic and vancomycin.   MEDICATIONS   Reviewed in [facility].   PHYSICAL EXAMINATION   VITAL SIGNS:  Blood pressure 133/86, pulse 106, respiratory rate 18,  temperature 98.4, O2 saturation is 99% on room air, pain 7/10.   GENERAL APPEARANCE:  The patient appears well developed and well nourished.   Appearance and behavior are age and situation appropriate.   HEENT:  Eyes:  Conjunctivae clear, lids normal.  Pupils equal, symmetrical,   and normally reactive.  Mouth/Throat:  Surfaces of the pharynx, palate, and   tongue are pink, moist, and without lesions.   NECK:  Supple and symmetrical.  Trachea midline.   LYMPHATICS:  No cervical or submandibular lymphadenopathy palpated.   LUNGS:  Expiratory wheezing bilaterally.  No tachypnea.  No accessory   muscle use.   HEART:  Regular rate and rhythm.   GI:  Abdomen soft, nontender, without complaint of pain to palpation.  No   hepatomegaly or splenomegaly.   MUSCULOSKELETAL:  Right upper extremity mildly swollen compared to left   upper extremity.  He has a superficial area of erythema over the radial   aspect of the wrist.  Also has a superficial area of erythema over the mid   forearm on the ulnar aspect.  His radial pulse is intact.  He has full    range of motion of the arm.  No streaking.   SKIN:  Warm and dry without rashes.   PSYCHIATRIC:  Recent and remote memory appear to be intact.   NEUROLOGICAL:  No focal deficits.   CONTINUATION BY DR. MANOLIO:   DIAGNOSTIC INTERPRETATION   Urinalysis is negative.  INR is low at 1.5.  BMP and CBC are normal except   for low hemoglobin and hematocrit of 10.3 and 30.7.  Chest x-ray is read   negative acute by radiology.   COURSE IN THE EMERGENCY DEPARTMENT   The patient was a very difficult stick, femoral stick was performed by Salem Caster, PA-C to obtain blood.   I explained to the patient that he should really stay in the hospital so   that we can coordinate infectious disease, orthopedics, and vascular, but   he needs to go back to [name] to make arrangements for his children   and will come back up tomorrow morning to check himself in.  I explained   that he would need to go through the ED again, that I would dictate in his   chart that he would be coming back in the morning.  He is given a dose of   Lovenox here 1.5 mg/kg to hold him over until the morning, also given 8   Percocet for the pain since he is out of those.  He does have Coumadin.   DIAGNOSES   1. Recurrent cellulitis bilateral arms.   2. Deep venous thrombosis right arm.   DISPOSITION   The patient is leaving against medical advice, but promises to come back in   the morning for admission.   CONTINUATION BY Salem Caster, PA-C:   PROCEDURE   Nursing staff was able to get some of the patient's blood work, but we were   not able to collect enough blood for the patient's INR to be run.  The   patient agreed to a femoral stick.  The patient's right groin was cleansed   with Betadine.  Sterile gloves were donned.  He was anesthetized in the   area of the femoral vein with 1% Lidocaine without epinephrine.  A 21-gauge   needle was then inserted at that site and approximately 10 mL of blood was    drawn on first attempt.  The procedure was tolerated well.  The needle was   removed from this site.  Pressure was applied for 2 minutes.  There was no   bleeding after pressure was removed.  A Band-Aid was applied to the site.   The procedure was tolerated well.   Electronically Signed By:   Imogene Burn, M.D. 07/05/2008 13:04   ____________________________   Imogene Burn, M.D.   Dictated by Salem Caster, P.A.   My signature above authenticates this document and my orders, the final   diagnosis(es), discharge prescription(s) and instructions in the [facility] record.   CF  D:  07/04/2008  T:  07/04/2008 12:37 A   [id number]

## 2008-07-04 NOTE — ED Provider Notes (Signed)
[facility]                      EMERGENCY DEPARTMENT TREATMENT REPORT   NAME:  [name]                  PT. LOCATION:     ER  [id number]   MR #:         BILLING #: [id number]          DOS: 07/04/2008   TIME: 6:11 P   62-13-08   cc:    Cherylynn Ridges, M.D.   PRIMARY CARE PHYSICIAN   Cherylynn Ridges, M.D., [facility]   CHIEF COMPLAINT   Return visit for possible admission.   HISTORY OF PRESENT ILLNESS   The patient  is a 38 year old male.  He was seen in this emergency room 1   day prior to arrival today, diagnosed with recurrent cellulitis and DVT in   the right arm. During that visit, it was suggested that he become an   inpatient in order to try and coordinate his care, as these problems   actually have been long-standing.  At that time, he  stated that he was   unable to remain here, so he signed out, saying that he would return. The   ER treatment from last visit states that the patient was given a dose of   Lovenox 1.5 mg/kg here and to hold him until morning and also a   prescription for 8 Percocets.  The patient returns today stating he has   been able to take care of everything he needs to and is here for admission,   if need be.  He has no new complaints.   REVIEW OF SYSTEMS   CONSTITUTIONAL:  No fever, chills, weight loss.   EYES: No visual symptoms.   ENT: No sore throat, runny nose or other URI symptoms.   ENDOCRINE:  No diabetic symptoms.   HEMATOLOGIC/LYMPHATIC:  No excessive bruising or lymph node swelling.   RESPIRATORY:  No cough, shortness of breath, or wheezing.   CARDIOVASCULAR:  As per HPI.   GASTROINTESTINAL:  No vomiting, diarrhea, or abdominal pain.   INTEGUMENTARY  As per HPI.   NEUROLOGICAL:  No headaches, sensory or motor symptoms.   PAST MEDICAL HISTORY   Significant for ulcerative colitis, COPD and [facility] spotted fever.   SURGICAL HISTORY   Orthopedic surgery.   PSYCHIATRIC HISTORY History of anxiety.   SOCIAL HISTORY    Denies alcohol and drug use.  Smokes tobacco.   FAMILY HISTORY   Noncontributory.   ALLERGIES   Mobic, vancomycin.   CURRENT MEDICATIONS   Percocet.   PHYSICAL EXAMINATION   VITAL SIGNS:  Blood pressure 117/74, respirations 20, O2 saturation is 98%   on room air, pulse 88, temperature 98.6, pain 9.   GENERAL APPEARANCE:  The patient appears well developed and well nourished.   Appearance and behavior are age and situation appropriate.   Eyes:  Conjunctivae clear, lids normal.  Pupils equal, symmetrical, and   normally reactive.   Ears/Nose:  Hearing is grossly intact to voice.  Internal and external   examinations of the ears and nose are unremarkable. Mouth/Throat:  Surfaces   of the pharynx, palate, and tongue are pink, moist, and without lesions.   RESPIRATORY:  Clear and equal breath sounds.  No respiratory distress,   tachypnea, or accessory muscle use.   CARDIOVASCULAR:  Heart is regular without  murmurs or rubs.   CHEST:  Chest symmetrical without masses or tenderness.   INTEGUMENTARY:  Exam of the upper extremities bilaterally--there is some   light erythema on the left upper arm with no ecchymosis and no obvious   swelling.  On the right forearm, there is an area that appears to be a   superficial thrombophlebitis.  The patient states that it is his right arm   that has the deep vein thrombosis that extends from the antecubital area up   into his  axillary area.  Examination of the left foot again revealed   light erythema across the dorsum of the foot with some minimal edema that   is nonpitting.  No induration.  No ecchymosis.  Skin is otherwise warm and   dry without rash or injury.   NEUROLOGIC:  Alert, oriented.  Sensation intact, motor strength equal and   symmetric.   CONTINUATION BY Orma Flaming, M.D.   INITIAL ASSESSMENT AND MANAGEMENT PLAN   This is an acute exacerbation of a chronic condition for this patient.   DIAGNOSTIC STUDIES A right upper extremity PVL was obtained which shows a    right upper extremity DVT. In addition, last night, the patient had an INR   checked which was subtherapeutic at 1.5. He also had a CBC done which was   normal.   RE-EVALUATION The patient remained stable while in the emergency   department.  He was given a dose of 1.5 mg/kg of subcutaneous Lovenox while   in the emergency department. I examined him myself. I did not find any   significant cellulitis.  The areas that were apparently infected are not   significantly red, and I find no induration in those areas. The patient   does have intact pulses in his right upper extremity.   CLINICAL IMPRESSION/ DIAGNOSIS   1. Right upper extremity deep venous thrombosis.  The patient is      subtherapeutic on his Coumadin.  His Coumadin dose has been increased at      the suggestion of Dr. Girtha Hake, and also, he will be given a prescription      for Lovenox to use until he is therapeutic.   2. Cellulitis. The patient's cellulitis appears to be resolving. His CBC      last night was normal.  He is afebrile here today, and I do not find any      significant signs of infection.   DISPOSITION AND PLANI spoke with Dr. Girtha Hake who will follow this patient up   as an outpatient.  Dr. Girtha Hake would like him to call in the morning, and   they will set up a way to check his INR.  Also, he is being discharged with   Lovenox to use as directed.  The patient already  knows how to use Lovenox.   In addition, he is being increased to 8 mg a day on Coumadin.  The patient   is not having any chest pain. He is not short of breath and has no vital   signs consistent with pulmonary embolism, so I do not believe he has a PE   at this time.  The patient was advised to return immediately here for   fever, worsening pain or difficulty breathing. Otherwise, he will contact   Dr. Salley Slaughter office first thing in the morning.   Electronically Signed By:   Orma Flaming, M.D. 07/05/2008   13:20   ____________________________   Orma Flaming, M.D.  My signature above authenticates this document and my orders, the final   diagnosis(es), discharge prescription(s) and instructions in the [facility] record.   PB  D:  07/04/2008  T:  07/05/2008  2:31 A   [id number]

## 2008-12-16 NOTE — ED Provider Notes (Signed)
[facility]                      EMERGENCY DEPARTMENT TREATMENT REPORT   NAME:  Gerwig, Jartavious R, III       SEX:            M   DATE:  12/16/2008                     DOB:            June 16, 1970   MR#    [id number]                       TIME SEEN        2:41 A   ACCT#  [id number]                      ROOM:           ER  ER70   cc:   CHIEF COMPLAINT:  Swollen and painful abscess, right arm.   HPI:  This is a 38 year old male who presents with complaints of swelling   to the midaspect of the right forearm.  States that he has pain that is a   5/10, aching intermittent pain that radiates up into the forearm.  The   patient has a history of an abscess that did require incision and drainage,   packing in the past.  He states that this was approximately 2-3 weeks ago,   and this was lower than the current lesion that he has.  It feels as if the   area is moving.  He denies any fever.   REVIEW OF SYSTEMS:   CONSTITUTIONAL:  Denies fever or chills.   MUSCULOSKELETAL:  As above.   INTEGUMENTARY:  As above.   NEUROLOGIC:  Denies paresthesias to the right hand.   PAST MEDICAL HISTORY:  Includes ulcerative colitis, COPD, [place]   spotted fever, right hand surgery, upper arm surgery.   SOCIAL HISTORY:  Positive for tobacco abuse.   CURRENT MEDICATIONS:  Bactrim finished days ago.   ALLERGIES:  Mobic, vancomycin   PHYSICAL EXAMINATION:   VITAL SIGNS:  Blood pressure 124/82, pulse 112, respirations 18,   temperature 98.3, pain 7/10.   GENERAL APPEARANCE:  This is a well-developed, well-nourished,   nonacute-appearing male who is in no distress.   HEENT:  Conjunctivae clear.  Hearing is grossly intact to voice.  External   examination of the ears and nose unremarkable.   MUSCULOSKELETAL:  The patient able to flex and extend at the wrist, pronate   and supinate the forearm without pain complaints.   INTEGUMENTARY:  The patient has a quarter size area near the dorsal aspect    of the right forearm that is more indurated, and there truly is fluctuance   that is flesh colored.  There is no erythema, no hyperthermia noted.   Not   variably tender to palpation.   INITIAL ASSESSMENT/MANAGEMENT PLAN:  This 38 year old male who presents   with a swollen painful area.  He does have evidence of prior little lesions   that are in various stages of healing on the right forearm.  We will   perform I and D.   PROCEDURE NOTE:  After verbal consent obtained from patient, the area was   cleansed with alcohol; anesthetized with lidocaine; infiltrated with   approximately 1 mL of 1%.  The  area was cleaned in a circular fashion x3   with Betadine.  Using an 11-blade scalpel, a 0.5 cm incision was made.   There was nothing but sanguineous discharge.  The area was explored with   hemostat to break up any possible loculation.  It was a very well walled   off appearing cyst.   FINAL DIAGNOSIS:  Acute right forearm cyst with incision and drainage.   DISPOSITION:  The patient discharged stable to home.  I do not think this   is an infection, possibly a ganglion cyst of a tendon.  He will refer to   surgery.  Return to the ED if symptoms persist or worsen.  The above   patient evaluated by myself and Dr. Marijo Sanes, who agrees.   ____________________________   Marijo Sanes, M.D.   Dictated By:  Nelda Bucks, PA-C   My signature above authenticates this document and my orders, the final   diagnosis(es), discharge prescription(s) and instructions in the [facility]   [facility] record.   st  D:  12/17/2008  T:  12/18/2008 12:30 A   [id number]

## 2009-04-07 NOTE — ED Provider Notes (Signed)
[facility]                      EMERGENCY DEPARTMENT TREATMENT REPORT   PRELIMINARY (DRAFT) -- FINAL REPORT  in [facility]   NAME:  [name], [name], [name]       SEX:            M   DATE:  04/07/2009                     DOB:            [birth year]   MR#    [id number]                       TIME SEEN        4:31 P   ACCT#  [id number]                      ROOM:           ER  [id number]   cc:   TIME OF MY EVALUATION:  1520.   CHIEF COMPLAINT:  Abscess on prostate.   HISTORY OF PRESENT ILLNESS:  This 38 year old male presents for evaluation   of rectal pain.  He has a history of prostatic abscess and was hospitalized   at [facility] from October 9 through March 26, 2009.  Initially he   had nonspecific low back pain which then progressed to perineal pain and   rectal discomfort.  The patient was also spiking fevers at home.  On   March 17, 2009, the patient was admitted to the [facility] and   underwent a transrectal drainage of a prostate abscess seen on CT scan.   The patient was transferred to [facility] for further   management.  He had initially been treated on Cipro, Bactrim, and   doxycycline.  The patient continues to have pain and feels like the abscess   is starting to redevelop.  He admits to fever, chills, and diaphoresis   especially at nighttime.  He also is complaining of dysuria with pelvic   spasms, occasional hematuria, urinary frequency and urgency.   REVIEW OF SYSTEMS:   CONSTITUTIONAL:  No fever, chills, diaphoresis.   EYES:  No visual symptoms.   ENT:  No URI symptoms.   ENDOCRINE: No diabetic symptoms.   HEMATOLOGIC:  No excessive bruising.   RESPIRATORY:  No cough, shortness of breath, or wheezing.   CARDIOVASCULAR:  No chest pain, chest pressure, or palpitations.   GASTROINTESTINAL:  Pelvic pain.  No vomiting, diarrhea.  Occasional nausea.   GENITOURINARY:  Per HPI.   MUSCULOSKELETAL:  Low back pain, nonspecific.    INTEGUMENTARY:  No rashes.   NEUROLOGICAL:  No headaches, sensory or motor symptoms.   PAST MEDICAL HISTORY:  Ulcerative colitis, COPD, [place] spotted   fever, history of prostate abscess with transurethral resection of prostate   and unroofing of abscess in October 2010.  History of orthopedic surgery   with decompression of L4-L5 with diskectomy, left hand surgery, right hand   surgery, upper arm surgery due to spider envenomation, right ankle surgery,   hernia repair left side, anxiety.   SOCIAL HISTORY:  Denies alcohol or illicit drug abuse.  Smokes tobacco at 1   pack per day.   MEDICATIONS:  Bactrim.   ALLERGIES:  Mobic and vancomycin.   PHYSICAL EXAMINATION:   VITAL SIGNS:  Blood pressure 115/73, pulse 89, respirations 20, temperature   98.5.   GENERAL:  The patient is a well-developed, well-nourished, Caucasian male.   He appears very uncomfortable but is nontoxic appearing.   HEENT:  Eyes:  Conjunctivae clear.  Pupils equally, symmetrical, and   normally reactive.  Mouth/Throat:  Buccal mucosa pink and moist.  Surface   of the pharynx without lesions.   NECK:  Supple, nontender, symmetrical.  No masses or JVD.  No cervical or   submandibular lymphadenopathy palpated.   RESPIRATORY:  Slightly decreased breath sounds in the lower lobes.  No   wheezing or rhonchi.  No accessory muscle use.   CARDIOVASCULAR:  Regular rate and rhythm.  No murmurs, gallops, rubs, or   thrills.  Backs of the calves soft and nontender.   GI:  Abdomen is soft.  Has some very minimal suprapubic tenderness.  No   organomegaly.  No abdominal masses appreciated by inspection or palpation.   RECTAL:  No masses or hemorrhoids.  Sphincter tone is normal.  The prostate   is slightly enlarged.  It is not hardened.  No masses felt.  No fluctuance   or induration.  He does have positive tenderness over the prostate.   MUSCULOSKELETAL:  No CVA tenderness.   SKIN : Warm and dry without rashes.    GU:  Male genitalia.  No lesions, inflammation, or discharge from the   penis.  Scrotum with testes descended, symmetric.  He has some tenderness   over the left testis.   CONTINUATION BY:  ANN CHINNIS, M.D.   REVIEW OF ED LABS, RESULTS, AND COURSE:  The patient had a BMP which was   normal.   His urinalysis showed small leukocyte esterase.  A CBC was   normal.  A micro UA showed 1-4 whites and occasional blood.   A CT scan showed a low density area in the prostate which may represent   either the prostatic urethra or an abscess that communicated with the   bladder or urethra, a fluid collection in the right psoas muscle, which   could be an intramuscular cyst abscess, or cystic necrosis of the tumor.   The patient, when asked about a psoas abscess, said that he has had an   abscess in the muscles of his pelvis and his perirectal area in the past.   At this time he localizes his symptoms uniquely to his rectum and to his   prostate and says it feels very similar to when he had his prostate   abscess.  His CT scan is not compatible with a recollection of fluid at   this time.  If there is a small amount of fluid on the CT, it communicates   with the bladder, although it seems likely that this may be the prostatic   urethra.  The patient is about due to come off his Bactrim tomorrow, so I   will have him do Bactrim for 7 more days.  I have stressed the importance   for him of following up with his urologist.  I will give him a prescription   for 10 Vicodin.   DIAGNOSIS:  Prostate pain, status post transrectal drainage of prostatic   abscess.   ____________________________   Jene Every, M.D.   Dictated By:  Adela Lank, PA-C   My signature above authenticates this document and my orders, the final   diagnosis(es), discharge prescription(s) and instructions in the [facility]   [facility] record.  st  D:  04/07/2009  T:  04/09/2009  5:26 A   [id number]   st  D:  04/07/2009  T:  04/09/2009  6:05 P   [id number]

## 2009-04-07 NOTE — ED Provider Notes (Signed)
[facility]                      EMERGENCY DEPARTMENT TREATMENT REPORT           PRELIMINARY (DRAFT) -- FINAL REPORT  in HPF   NAME:  [name], [name], [name]       SEX:            M   DATE:  04/07/2009                     DOB:            April 06, 1971   MR#    [id number]                       TIME SEEN        8:05 P   ACCT#  [id number]                      ROOM:           ER  [id number]       cc:           CONTINUATION BY:  ANN CHINNIS, M.D.       REVIEW OF ED LABS, RESULTS, AND COURSE:  The patient had a BMP which was   normal.   His urinalysis showed small leukocyte esterase.  A CBC was   normal.  A micro UA showed 1-4 whites and occasional blood.       A CT scan showed a low density area in the prostate which may represent   either the prostatic urethra or an abscess that communicated with the   bladder or urethra, a fluid collection in the right psoas muscle, which   could be an intramuscular cyst abscess, or cystic necrosis of the tumor.       The patient, when asked about a psoas abscess, said that he has had an   abscess in the muscles of his pelvis and his perirectal area in the past.   At this time he localizes his symptoms uniquely to his rectum and to his   prostate and says it feels very similar to when he had his prostate   abscess.  His CT scan is not compatible with a recollection of fluid at   this time.  If there is a small amount of fluid on the CT, it communicates   with the bladder, although it seems likely that this may be the prostatic   urethra.  The patient is about due to come off his Bactrim tomorrow, so I   will have him do Bactrim for 7 more days.  I have stressed the importance   for him of following up with his urologist.  I will give him a prescription   for 10 Vicodin.       DIAGNOSIS:  Prostate pain, status post transrectal drainage of prostatic   abscess.       &lt;MDMsgStart:&gt;    Partial Report:  Waiting to be merged.   &lt;MDMsgEnd:&gt;                    On Hold           ____________________________   [name], M.D.       Dictated By:       My signature above authenticates this document and my orders, the final   diagnosis(es),  discharge prescription(s) and instructions in the [facility]   [facility] record.       st  D:  04/07/2009  T:  04/09/2009  6:05 P   [id number]

## 2009-04-07 NOTE — ED Provider Notes (Signed)
[facility]                      EMERGENCY DEPARTMENT TREATMENT REPORT           PRELIMINARY (DRAFT) -- FINAL REPORT  in HPF   NAME:  [name], [name], [name]       SEX:            M   DATE:  04/07/2009                     DOB:            [birth year]   MR#    [id number]                       TIME SEEN        4:31 P   ACCT#  [id number]                      ROOM:           ER  [id number]       cc:           TIME OF MY EVALUATION:  1520.       CHIEF COMPLAINT:  Abscess on prostate.       HISTORY OF PRESENT ILLNESS:  This 38 year old male presents for evaluation   of rectal pain.  He has a history of prostatic abscess and was hospitalized   at [facility] from October 9 through March 26, 2009.  Initially he   had nonspecific low back pain which then progressed to perineal pain and   rectal discomfort.  The patient was also spiking fevers at home.  On   March 17, 2009, the patient was admitted to the [facility] and   underwent a transrectal drainage of a prostate abscess seen on CT scan.   The patient was transferred to [facility] for further   management.  He had initially been treated on Cipro, Bactrim, and   doxycycline.  The patient continues to have pain and feels like the abscess   is starting to redevelop.  He admits to fever, chills, and diaphoresis   especially at nighttime.  He also is complaining of dysuria with pelvic   spasms, occasional hematuria, urinary frequency and urgency.       REVIEW OF SYSTEMS:   CONSTITUTIONAL:  No fever, chills, diaphoresis.   EYES:  No visual symptoms.   ENT:  No URI symptoms.   ENDOCRINE: No diabetic symptoms.   HEMATOLOGIC:  No excessive bruising.   RESPIRATORY:  No cough, shortness of breath, or wheezing.   CARDIOVASCULAR:  No chest pain, chest pressure, or palpitations.   GASTROINTESTINAL:  Pelvic pain.  No vomiting, diarrhea.  Occasional nausea.       GENITOURINARY:  Per HPI.    MUSCULOSKELETAL:  Low back pain, nonspecific.   INTEGUMENTARY:  No rashes.   NEUROLOGICAL:  No headaches, sensory or motor symptoms.       PAST MEDICAL HISTORY:  Ulcerative colitis, COPD, [place] spotted   fever, history of prostate abscess with transurethral resection of prostate   and unroofing of abscess in October 2010.  History of orthopedic surgery   with decompression of L4-L5 with diskectomy, left hand surgery, right hand   surgery, upper arm surgery due to spider envenomation, right ankle surgery,   hernia repair left side, anxiety.  SOCIAL HISTORY:  Denies alcohol or illicit drug abuse.  Smokes tobacco at 1   pack per day.       MEDICATIONS:  Bactrim.       ALLERGIES:  Mobic and vancomycin.       PHYSICAL EXAMINATION:   VITAL SIGNS:  Blood pressure 115/73, pulse 89, respirations 20, temperature   98.5.   GENERAL:  The patient is a well-developed, well-nourished, Caucasian male.   He appears very uncomfortable but is nontoxic appearing.   HEENT:  Eyes:  Conjunctivae clear.  Pupils equally, symmetrical, and   normally reactive.  Mouth/Throat:  Buccal mucosa pink and moist.  Surface   of the pharynx without lesions.   NECK:  Supple, nontender, symmetrical.  No masses or JVD.  No cervical or   submandibular lymphadenopathy palpated.   RESPIRATORY:  Slightly decreased breath sounds in the lower lobes.  No   wheezing or rhonchi.  No accessory muscle use.   CARDIOVASCULAR:  Regular rate and rhythm.  No murmurs, gallops, rubs, or   thrills.  Backs of the calves soft and nontender.   GI:  Abdomen is soft.  Has some very minimal suprapubic tenderness.  No   organomegaly.  No abdominal masses appreciated by inspection or palpation.   RECTAL:  No masses or hemorrhoids.  Sphincter tone is normal.  The prostate   is slightly enlarged.  It is not hardened.  No masses felt.  No fluctuance   or induration.  He does have positive tenderness over the prostate.   MUSCULOSKELETAL:  No CVA tenderness.    SKIN : Warm and dry without rashes.   GU:  Male genitalia.  No lesions, inflammation, or discharge from the   penis.  Scrotum with testes descended, symmetric.  He has some tenderness   over the left testis.       CONTINUATION BY:  ANN CHINNIS, M.D.       REVIEW OF ED LABS, RESULTS, AND COURSE:  The patient had a BMP which was   normal.   His urinalysis showed small leukocyte esterase.  A CBC was   normal.  A micro UA showed 1-4 whites and occasional blood.       A CT scan showed a low density area in the prostate which may represent   either the prostatic urethra or an abscess that communicated with the   bladder or urethra, a fluid collection in the right psoas muscle, which   could be an intramuscular cyst abscess, or cystic necrosis of the tumor.       The patient, when asked about a psoas abscess, said that he has had an   abscess in the muscles of his pelvis and his perirectal area in the past.   At this time he localizes his symptoms uniquely to his rectum and to his   prostate and says it feels very similar to when he had his prostate   abscess.  His CT scan is not compatible with a recollection of fluid at   this time.  If there is a small amount of fluid on the CT, it communicates   with the bladder, although it seems likely that this may be the prostatic   urethra.  The patient is about due to come off his Bactrim tomorrow, so I   will have him do Bactrim for 7 more days.  I have stressed the importance   for him of following up with his urologist.  I will give  him a prescription   for 10 Vicodin.       DIAGNOSIS:  Prostate pain, status post transrectal drainage of prostatic   abscess.                                   ____________________________   Jene Every, M.D.       Dictated By:  Adela Lank, PA-C       My signature above authenticates this document and my orders, the final   diagnosis(es), discharge prescription(s) and instructions in the [facility]   [facility] record.        st  D:  04/07/2009  T:  04/09/2009  5:26 A   [id number]   st  D:  04/07/2009  T:  04/09/2009  6:05 P   [id number]

## 2015-08-29 ENCOUNTER — Emergency Department (HOSPITAL_COMMUNITY)
Admission: EM | Admit: 2015-08-29 | Discharge: 2015-08-30 | Disposition: A | Discharge status: Home / Self Care | Payer: 59 | Attending: Emergency Medicine | Admitting: Emergency Medicine

## 2015-08-29 ENCOUNTER — Encounter (HOSPITAL_COMMUNITY): Payer: Self-pay

## 2015-08-29 DIAGNOSIS — F17210 Nicotine dependence, cigarettes, uncomplicated: Secondary | ICD-10-CM | POA: Diagnosis not present

## 2015-08-29 DIAGNOSIS — K6289 Other specified diseases of anus and rectum: Secondary | ICD-10-CM | POA: Diagnosis present

## 2015-08-29 DIAGNOSIS — R110 Nausea: Secondary | ICD-10-CM | POA: Insufficient documentation

## 2015-08-29 DIAGNOSIS — N410 Acute prostatitis: Secondary | ICD-10-CM | POA: Insufficient documentation

## 2015-08-29 HISTORY — DX: Ulcerative colitis, unspecified, without complications: K51.90

## 2015-08-29 NOTE — ED Notes (Signed)
It is either my prostate or colitis. Having stabbing pain in my rectal area and in my scrotum per pt. Having swelling in my lower abdomen. I have been passing bright red blood in my stool. Last time there was blood was yesterday.

## 2015-08-30 ENCOUNTER — Emergency Department (HOSPITAL_COMMUNITY): Payer: 59

## 2015-08-30 LAB — CBC WITH DIFFERENTIAL/PLATELET
Basophils Absolute: 0 10*3/uL (ref 0.0–0.1)
Basophils Relative: 0 %
EOS ABS: 0.1 10*3/uL (ref 0.0–0.7)
EOS PCT: 2 %
HCT: 42.6 % (ref 39.0–52.0)
Hemoglobin: 14.6 g/dL (ref 13.0–17.0)
LYMPHS ABS: 2.8 10*3/uL (ref 0.7–4.0)
LYMPHS PCT: 35 %
MCH: 33 pg (ref 26.0–34.0)
MCHC: 34.3 g/dL (ref 30.0–36.0)
MCV: 96.4 fL (ref 78.0–100.0)
MONO ABS: 0.5 10*3/uL (ref 0.1–1.0)
Monocytes Relative: 7 %
Neutro Abs: 4.6 10*3/uL (ref 1.7–7.7)
Neutrophils Relative %: 56 %
PLATELETS: 215 10*3/uL (ref 150–400)
RBC: 4.42 MIL/uL (ref 4.22–5.81)
RDW: 13.1 % (ref 11.5–15.5)
WBC: 8.1 10*3/uL (ref 4.0–10.5)

## 2015-08-30 LAB — COMPREHENSIVE METABOLIC PANEL
ALT: 16 U/L — ABNORMAL LOW (ref 17–63)
ANION GAP: 8 (ref 5–15)
AST: 21 U/L (ref 15–41)
Albumin: 4 g/dL (ref 3.5–5.0)
Alkaline Phosphatase: 55 U/L (ref 38–126)
BUN: 16 mg/dL (ref 6–20)
CO2: 24 mmol/L (ref 22–32)
CREATININE: 0.9 mg/dL (ref 0.61–1.24)
Calcium: 8.8 mg/dL — ABNORMAL LOW (ref 8.9–10.3)
Chloride: 105 mmol/L (ref 101–111)
Glucose, Bld: 92 mg/dL (ref 65–99)
POTASSIUM: 3.7 mmol/L (ref 3.5–5.1)
SODIUM: 137 mmol/L (ref 135–145)
Total Bilirubin: 0.6 mg/dL (ref 0.3–1.2)
Total Protein: 7.5 g/dL (ref 6.5–8.1)

## 2015-08-30 LAB — URINALYSIS, ROUTINE W REFLEX MICROSCOPIC
BILIRUBIN URINE: NEGATIVE
Glucose, UA: NEGATIVE mg/dL
Hgb urine dipstick: NEGATIVE
Ketones, ur: NEGATIVE mg/dL
LEUKOCYTES UA: NEGATIVE
NITRITE: NEGATIVE
PH: 6.5 (ref 5.0–8.0)
Protein, ur: NEGATIVE mg/dL

## 2015-08-30 MED ORDER — CIPROFLOXACIN HCL 500 MG PO TABS: 500.0000 mg | ORAL_TABLET | Freq: Two times a day (BID) | ORAL | Status: DC

## 2015-08-30 MED ORDER — HYDROMORPHONE HCL 1 MG/ML IJ SOLN
1.0000 mg | Freq: Once | INTRAMUSCULAR | Status: AC
  Administered 2015-08-30: 1 mg via INTRAVENOUS
  Filled 2015-08-30: qty 1

## 2015-08-30 MED ORDER — IOHEXOL 300 MG/ML  SOLN
100.0000 mL | Freq: Once | INTRAMUSCULAR | Status: AC | PRN
  Administered 2015-08-30: 100 mL via INTRAVENOUS

## 2015-08-30 MED ORDER — ONDANSETRON HCL 4 MG/2ML IJ SOLN
4.0000 mg | Freq: Once | INTRAMUSCULAR | Status: AC
  Administered 2015-08-30: 4 mg via INTRAVENOUS
  Filled 2015-08-30: qty 2

## 2015-08-30 MED ORDER — MORPHINE SULFATE (PF) 4 MG/ML IV SOLN
4.0000 mg | Freq: Once | INTRAVENOUS | Status: AC
  Administered 2015-08-30: 4 mg via INTRAVENOUS
  Filled 2015-08-30: qty 1

## 2015-08-30 NOTE — ED Notes (Signed)
Pt updated on plan of care,  

## 2015-08-30 NOTE — ED Provider Notes (Signed)
CSN: [id number]     Arrival date & time 08/29/15  1908 History   First MD Initiated Contact with Patient 08/29/15 2326     Chief Complaint  Patient presents with  . Rectal Bleeding  . Rectal Pain     (Consider location/radiation/quality/duration/timing/severity/associated sxs/prior Treatment) The history is provided by the patient.   Brian Phelps is a 45 y.o. male with a history of usually quiescent ulcerative colitis (stating last need for asacol for this condition was one year ago) presenting with a one week history of low rectal pain which has become severe and radiates into his scrotum.  He has also had rectal bleeding with bm's only, last episode of this occurred yesterday.  He has a history of MRSA infection since back surgery in 2010 which resulted in an abscess of his prostate.  He endorses his pain is very reminiscent of the prostate abscess.  He denies fevers or chills at this time.  He does have nausea without emesis.  He denies diarrhea, dysuria or penile discharge, but urination is painful for him.      Past Medical History  Diagnosis Date  . Ulcerative colitis [facility](HCC)    Past Surgical History  Procedure Laterality Date  . Prostate surgery    . Ankle surgery    . Knee surgery    . Hernia repair    . Back surgery     No family history on file. Social History  Substance Use Topics  . Smoking status: Current Every Day Smoker -- 0.50 packs/day    Types: Cigarettes  . Smokeless tobacco: None  . Alcohol Use: Yes    Review of Systems  Constitutional: Negative for fever.  HENT: Negative.   Eyes: Negative.   Respiratory: Negative for chest tightness and shortness of breath.   Cardiovascular: Negative for chest pain.  Gastrointestinal: Positive for nausea, blood in stool and rectal pain. Negative for vomiting, abdominal pain, diarrhea and constipation.  Genitourinary: Positive for dysuria and testicular pain. Negative for hematuria, discharge and scrotal swelling.   Musculoskeletal: Negative for joint swelling, arthralgias and neck pain.  Skin: Negative.  Negative for rash and wound.  Neurological: Negative for weakness and headaches.  Psychiatric/Behavioral: Negative.       Allergies  Vancomycin  Home Medications   Prior to Admission medications   Not on File   BP 120/81 mmHg  Pulse 82  Temp(Src) 98.2 F (36.8 C) (Oral)  Resp 20  Ht 5\' 9"  (1.753 m)  Wt 86.637 kg  BMI 28.19 kg/m2  SpO2 100% Physical Exam  Constitutional: He appears well-developed and well-nourished.  HENT:  Head: Normocephalic and atraumatic.  Eyes: Conjunctivae are normal.  Neck: Neck supple.  Cardiovascular: Normal rate, regular rhythm, normal heart sounds and intact distal pulses.   Pulmonary/Chest: Effort normal and breath sounds normal. He has no wheezes.  Abdominal: Soft. Bowel sounds are normal. There is no tenderness.  Genitourinary: Testes normal and penis normal. Prostate is enlarged and tender. Right testis shows no mass, no swelling and no tenderness. Left testis shows no mass, no swelling and no tenderness.  Boggy prostate. Extremely ttp.  Pain is not reproduced with palpation of testes or scrotum.  Musculoskeletal: Normal range of motion.  Neurological: He is alert.  Skin: Skin is warm and dry.  Psychiatric: He has a normal mood and affect.  Nursing note and vitals reviewed.  Chaperone was present during exam.   ED Course  Procedures (including critical care time)  Pt given IV  morphine without relief of pain.  Added dilaudid 1 mg IV after back from Ct.  More comfortable.  Labs Review Labs Reviewed  CBC WITH DIFFERENTIAL/PLATELET  COMPREHENSIVE METABOLIC PANEL  URINALYSIS, ROUTINE W REFLEX MICROSCOPIC (NOT AT [facility])    Imaging Review No results found. I have personally reviewed and evaluated these images and lab results as part of my medical decision-making.   EKG Interpretation None      MDM   Final diagnoses:  None     Pending CT results.  Signed pt out to Dr. Rhunette Croft.      Burgess Amor, PA-C 08/30/15 0205  Derwood Kaplan, MD 08/30/15 1610

## 2015-08-30 NOTE — ED Notes (Signed)
Pt states that the pain medication did not help, Raynelle FanningJulie PA notified, additional orders given,

## 2015-08-30 NOTE — ED Notes (Signed)
Pt c/o rectal pain that started this week and has progressively gotten worse, Raynelle FanningJulie PA at bedside,

## 2015-08-30 NOTE — Discharge Instructions (Signed)
See the urologist as requested. Finish the antibiotics course.   Prostatitis The prostate gland is about the size and shape of a walnut. It is located just below your bladder. It produces one of the components of semen, which is made up of sperm and the fluids that help nourish and transport it out from the testicles. Prostatitis is inflammation of the prostate gland.  There are four types of prostatitis:  Acute bacterial prostatitis. This is the least common type of prostatitis. It starts quickly and usually is associated with a bladder infection, high fever, and shaking chills. It can occur at any age.  Chronic bacterial prostatitis. This is a persistent bacterial infection in the prostate. It usually develops from repeated acute bacterial prostatitis or acute bacterial prostatitis that was not properly treated. It can occur in men of any age but is most common in middle-aged men whose prostate has begun to enlarge. The symptoms are not as severe as those in acute bacterial prostatitis. Discomfort in the part of your body that is in front of your rectum and below your scrotum (perineum), lower abdomen, or in the head of your penis (glans) may represent your primary discomfort.  Chronic prostatitis (nonbacterial). This is the most common type of prostatitis. It is inflammation of the prostate gland that is not caused by a bacterial infection. The cause is unknown and may be associated with a viral infection or autoimmune disorder.  Prostatodynia (pelvic floor disorder). This is associated with increased muscular tone in the pelvis surrounding the prostate. CAUSES The causes of bacterial prostatitis are bacterial infection. The causes of the other types of prostatitis are unknown.  SYMPTOMS  Symptoms can vary depending upon the type of prostatitis that exists. There can also be overlap in symptoms. Possible symptoms for each type of prostatitis are listed below. Acute Bacterial  Prostatitis  Painful urination.  Fever or chills.  Muscle or joint pains.  Low back pain.  Low abdominal pain.  Inability to empty bladder completely. Chronic Bacterial Prostatitis, Chronic Nonbacterial Prostatitis, and Prostatodynia  Sudden urge to urinate.  Frequent urination.  Difficulty starting urine stream.  Weak urine stream.  Discharge from the urethra.  Dribbling after urination.  Rectal pain.  Pain in the testicles, penis, or tip of the penis.  Pain in the perineum.  Problems with sexual function.  Painful ejaculation.  Bloody semen. DIAGNOSIS  In order to diagnose prostatitis, your health care provider will ask about your symptoms. One or more urine samples will be taken and tested (urinalysis). If the urinalysis result is negative for bacteria, your health care provider may use a finger to feel your prostate (digital rectal exam). This exam helps your health care provider determine if your prostate is swollen and tender. It will also produce a specimen of semen that can be analyzed. TREATMENT  Treatment for prostatitis depends on the cause. If a bacterial infection is the cause, it can be treated with antibiotic medicine. In cases of chronic bacterial prostatitis, the use of antibiotics for up to 1 month or 6 weeks may be necessary. Your health care provider may instruct you to take sitz baths to help relieve pain. A sitz bath is a bath of hot water in which your hips and buttocks are under water. This relaxes the pelvic floor muscles and often helps to relieve the pressure on your prostate. HOME CARE INSTRUCTIONS   Take all medicines as directed by your health care provider.  Take sitz baths as directed by your  health care provider. SEEK MEDICAL CARE IF:   Your symptoms get worse, not better.  You have a fever. SEEK IMMEDIATE MEDICAL CARE IF:   You have chills.  You feel nauseous or vomit.  You feel lightheaded or faint.  You are unable to  urinate.  You have blood or blood clots in your urine. MAKE SURE YOU:  Understand these instructions.  Will watch your condition.  Will get help right away if you are not doing well or get worse.   This information is not intended to replace advice given to you by your health care provider. Make sure you discuss any questions you have with your health care provider.   Document Released: 05/28/2000 Document Revised: 06/21/2014 Document Reviewed: 12/18/2012 Elsevier Interactive Patient Education [employer]

## 2015-11-08 ENCOUNTER — Emergency Department (HOSPITAL_COMMUNITY): Payer: 59

## 2015-11-08 ENCOUNTER — Encounter (HOSPITAL_COMMUNITY): Payer: Self-pay | Admitting: Emergency Medicine

## 2015-11-08 ENCOUNTER — Emergency Department (HOSPITAL_COMMUNITY)
Admission: EM | Admit: 2015-11-08 | Discharge: 2015-11-08 | Disposition: A | Discharge status: Home / Self Care | Payer: 59 | Attending: Emergency Medicine | Admitting: Emergency Medicine

## 2015-11-08 DIAGNOSIS — F17210 Nicotine dependence, cigarettes, uncomplicated: Secondary | ICD-10-CM | POA: Diagnosis not present

## 2015-11-08 DIAGNOSIS — M25561 Pain in right knee: Secondary | ICD-10-CM | POA: Diagnosis present

## 2015-11-08 DIAGNOSIS — M25461 Effusion, right knee: Secondary | ICD-10-CM | POA: Diagnosis not present

## 2015-11-08 DIAGNOSIS — Z791 Long term (current) use of non-steroidal anti-inflammatories (NSAID): Secondary | ICD-10-CM | POA: Insufficient documentation

## 2015-11-08 MED ORDER — HYDROCODONE-ACETAMINOPHEN 5-325 MG PO TABS
1.0000 | ORAL_TABLET | Freq: Once | ORAL | Status: AC
  Administered 2015-11-08: 1 via ORAL
  Filled 2015-11-08: qty 1

## 2015-11-08 MED ORDER — HYDROCODONE-ACETAMINOPHEN 5-325 MG PO TABS
1.0000 | ORAL_TABLET | ORAL | Status: DC | PRN
Start: 2015-11-08 — End: 2015-11-13

## 2015-11-08 NOTE — ED Notes (Signed)
Right knee pain for last 3 weeks, rates pain 9/10.  Denies injury.  Took motrin at 0700, no relief.

## 2015-11-08 NOTE — ED Provider Notes (Signed)
CSN: [id number]     Arrival date & time 11/08/15  4098 History   First MD Initiated Contact with Patient 11/08/15 1020     Chief Complaint  Patient presents with  . Knee Pain    right     (Consider location/radiation/quality/duration/timing/severity/associated sxs/prior Treatment) The history is provided by the patient.   Brian Phelps is a 45 y.o. male with a past surgical history of right knee ligament repair and cyst excision at home in [facility] (here for a work job Scientist, physiological) presenting with a 3 week history Of right knee soreness which he simply attributed to increased bending and kneeling with his job which he has been treating with ibuprofen.  Yesterday he felt a sudden jolt of pain along the medial right knee with severe pain since he has difficulty completely extending the knee and weightbearing is very painful.  He has been taking routine ibuprofen, last dose of 800 mg 4 hours ago without relief of pain.  He endorses swelling in the joint and pressure sensation with attempts to flex the knee.     Past Medical History  Diagnosis Date  . Ulcerative colitis [facility])    Past Surgical History  Procedure Laterality Date  . Prostate surgery    . Ankle surgery    . Knee surgery    . Hernia repair    . Back surgery     History reviewed. No pertinent family history. Social History  Substance Use Topics  . Smoking status: Current Every Day Smoker -- 0.50 packs/day    Types: Cigarettes  . Smokeless tobacco: None  . Alcohol Use: Yes    Review of Systems  Constitutional: Negative for fever.  Musculoskeletal: Positive for joint swelling and arthralgias. Negative for myalgias.  Neurological: Negative for weakness and numbness.      Allergies  Vancomycin  Home Medications   Prior to Admission medications   Medication Sig Start Date End Date Taking? Authorizing Provider  ibuprofen (ADVIL,MOTRIN) 200 MG tablet Take 800 mg by mouth every 6 (six) hours as  needed for moderate pain.   Yes Historical Provider, MD  HYDROcodone-acetaminophen (NORCO/VICODIN) 5-325 MG tablet Take 1 tablet by mouth every 4 (four) hours as needed. 11/08/15   Burgess Amor, PA-C   BP 119/77 mmHg  Pulse 88  Temp(Src) 98.2 F (36.8 C) (Oral)  Resp 20  Ht  (1.753 m)  Wt 81.647 kg  BMI 26.57 kg/m2  SpO2 96% Physical Exam  Constitutional: He appears well-developed and well-nourished.  HENT:  Head: Atraumatic.  Neck: Normal range of motion.  Cardiovascular:  Pulses:      Dorsalis pedis pulses are 2+ on the right side, and 2+ on the left side.  Pulses equal bilaterally  Musculoskeletal: He exhibits edema and tenderness.       Right knee: He exhibits swelling and bony tenderness. He exhibits no effusion, no ecchymosis, no deformity, no erythema, no LCL laxity and no MCL laxity. Tenderness found. Medial joint line tenderness noted.  Increased pain with valgus strain.  Neurological: He is alert. He has normal strength. He displays normal reflexes. No sensory deficit.  Skin: Skin is warm and dry.  Psychiatric: He has a normal mood and affect.    ED Course  Procedures (including critical care time) Labs Review Labs Reviewed - No data to display  Imaging Review Dg Knee Complete 4 Views Right  11/08/2015  CLINICAL DATA:  Medial right knee pain around the patella. EXAM: RIGHT KNEE -  COMPLETE 4+ VIEW COMPARISON:  None. FINDINGS: No evidence of fracture or dislocation. Small joint effusion. No evidence of arthropathy or other focal bone abnormality. Soft tissues are unremarkable. IMPRESSION: No acute osseous injury of the right knee. Small joint effusion. Electronically Signed   By: Elige KoHetal  Patel   On: 11/08/2015 11:17   I have personally reviewed and evaluated these images and lab results as part of my medical decision-making.   EKG Interpretation None      MDM   Final diagnoses:  Knee effusion, right     Radiological studies were viewed, interpreted and  considered during the medical decision making and disposition process. I agree with radiologists reading.  Results were also discussed with patient.  Patient was placed in knee immobilizer, crutches given.  Hydrocodone.  Advised rest, ice as much as possible.  Referral to Dr. Luiz BlareGraves for further evaluation of this problem.  Suspect patient has sustained a medial collateral ligament or medial meniscal injury or tear.     Burgess AmorJulie Marvella Jenning, PA-C 11/08/15 1134  Bethann BerkshireJoseph Zammit, MD 11/08/15 [phone]

## 2015-11-08 NOTE — Discharge Instructions (Signed)
Knee Effusion Knee effusion means that you have excess fluid in your knee joint. This can cause pain and swelling in your knee. This may make your knee more difficult to bend and move. That is because there is increased pain and pressure in the joint. If there is fluid in your knee, it often means that something is wrong inside your knee, such as severe arthritis, abnormal inflammation, or an infection. Another common cause of knee effusion is an injury to the knee muscles, ligaments, or cartilage. HOME CARE INSTRUCTIONS  Use crutches as directed by your health care provider.  Wear a knee brace as directed by your health care provider.  Apply ice to the swollen area:  Put ice in a plastic bag.  Place a towel between your skin and the bag.  Leave the ice on for 20 minutes, 2-3 times per day.  Keep your knee raised (elevated) when you are sitting or lying down.  Take medicines only as directed by your health care provider.  Do any rehabilitation or strengthening exercises as directed by your health care provider.  Rest your knee as directed by your health care provider. You may start doing your normal activities again when your health care provider approves.   Keep all follow-up visits as directed by your health care provider. This is important. SEEK MEDICAL CARE IF:  You have ongoing (persistent) pain in your knee. SEEK IMMEDIATE MEDICAL CARE IF:  You have increased swelling or redness of your knee.  You have severe pain in your knee.  You have a fever.   This information is not intended to replace advice given to you by your health care provider. Make sure you discuss any questions you have with your health care provider.   Document Released: 08/21/2003 Document Revised: 06/21/2014 Document Reviewed: 01/14/2014 Elsevier Interactive Patient Education 2016 [employer]    You injury and subsequent fluid collection in your knee is suggestive of either a medial collateral  ligament tear or a meniscal injury (the cartilage along your medial knee joint space.  Ice and elevate your knee as much as possible, wear the knee immobilizer to minimize movement of the joint and use crutches to minimize weightbearing.  Do not drive within 4 hours of taking hydrocodone as this medication will make you drowsy.  Call Dr. Luiz BlareGraves for further evaluation and management of your knee pain.  Also continue taking your ibuprofen 800 mg every 8 hours.

## 2015-11-13 ENCOUNTER — Encounter: Payer: Self-pay | Admitting: Orthopaedic Surgery

## 2015-11-13 ENCOUNTER — Ambulatory Visit (INDEPENDENT_AMBULATORY_CARE_PROVIDER_SITE_OTHER): Payer: Managed Care, Other (non HMO) | Admitting: Orthopaedic Surgery

## 2015-11-13 VITALS — BP 117/76 | HR 88 | Temp 98.1°F | Ht 69.0 in | Wt 174.0 lb

## 2015-11-13 DIAGNOSIS — M25561 Pain in right knee: Secondary | ICD-10-CM | POA: Diagnosis not present

## 2015-11-13 MED ORDER — OXYCODONE-ACETAMINOPHEN 7.5-325 MG PO TABS: ORAL_TABLET | ORAL | Status: DC

## 2015-11-13 NOTE — Progress Notes (Signed)
Subjective:  My right knee is hurting bad    Patient ID: Brian Phelps, male    DOB: 10-26-1970, 45 y.o.   MRN: [id number]  Knee Pain  The incident occurred 3 to 5 days ago. The pain is present in the right knee. The quality of the pain is described as aching and burning. The pain is at a severity of 6/10. The pain is moderate. The pain has been worsening since onset. Associated symptoms include an inability to bear weight and a loss of motion. Pertinent negatives include no loss of sensation, muscle weakness, numbness or tingling. The symptoms are aggravated by weight bearing and movement. He has tried elevation, ice, immobilization, NSAIDs, non-weight bearing and rest for the symptoms. The treatment provided mild relief.   He has a history of right ACL repair in [place] area about six or seven years ago.  He did well.  About three weeks ago he began to have a slight tenderness in the right knee.  He [employer].  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  On 11-07-15 he had finished up with his job and suddenly felt a sever jolt of pain along the medial knee.  It lasted a minute or two and has recurring pain if the knee is stressed to the medial side on the right.  He cannot walk on it.  He was seen in the ER on 11-08-15.  I have reviewed the notes from the ER, the x-ray report and x-rays.  He was given ibuprofen and pain medicine.  He is not any better, in fact, he says he is worse.  He has tried knee immobilizer which makes the pain intolerable he says.  He has used ice, heat, elevation and crutches.  The crutches work best as does rest.  He is very concerned about his knee.  He has instability now as well.  He is concerned he may have torn the ACL again.     Review of Systems  HENT: Negative for congestion.   Respiratory: Negative for cough and shortness of breath.   Cardiovascular: Negative for chest pain and leg swelling.  Endocrine: Negative for cold intolerance.  Musculoskeletal:  Positive for joint swelling, arthralgias and gait problem.  Allergic/Immunologic: Negative for environmental allergies.  Neurological: Negative for tingling and numbness.   Past Medical History  Diagnosis Date  . Ulcerative colitis [facility](HCC)     Past Surgical History  Procedure Laterality Date  . Prostate surgery    . Ankle surgery    . Knee surgery    . Hernia repair    . Back surgery      Current Outpatient Prescriptions on File Prior to Visit  Medication Sig Dispense Refill  . ibuprofen (ADVIL,MOTRIN) 200 MG tablet Take 800 mg by mouth every 6 (six) hours as needed for moderate pain.     No current facility-administered medications on file prior to visit.    Social History   Social History  . Marital Status: Single    Spouse Name: N/A  . Number of Children: N/A  . Years of Education: N/A   Occupational History  . Not on file.   Social History Main Topics  . Smoking status: Current Every Day Smoker -- 0.50 packs/day    Types: Cigarettes  . Smokeless tobacco: Not on file  . Alcohol Use: Yes  . Drug Use: No  . Sexual Activity: Not on file   Other Topics Concern  . Not on file  Social History Narrative    BP 117/76 mmHg  Pulse 88  Temp(Src) 98.1 F (36.7 C)  Ht  (1.753 m)  Wt 174 lb (78.926 kg)  BMI 25.68 kg/m2     Objective:   Physical Exam  Constitutional: He is oriented to person, place, and time. He appears well-developed and well-nourished.  HENT:  Head: Normocephalic and atraumatic.  Eyes: Conjunctivae and EOM are normal. Pupils are equal, round, and reactive to light.  Neck: Normal range of motion. Neck supple.  Cardiovascular: Normal rate, regular rhythm and intact distal pulses.   Pulmonary/Chest: Effort normal.  Abdominal: Soft.  Musculoskeletal: He exhibits tenderness (He has marked pain of the right knee MCL area with pain to any stress to this area, he has 1+ effusion, ROM limited, Lachman weakly positive but he is in so much pain, no  redness, NV intact..  Left knee negative.).       Right knee: He exhibits decreased range of motion, swelling and effusion. Tenderness found. Medial joint line and MCL tenderness noted.       Legs: Neurological: He is alert and oriented to person, place, and time. He has normal reflexes. No cranial nerve deficit. He exhibits normal muscle tone. Coordination normal.  Skin: Skin is warm and dry.  Psychiatric: He has a normal mood and affect. His behavior is normal. Judgment and thought content normal.          Assessment & Plan:   Encounter Diagnosis  Name Primary?  . Right knee pain Yes   I am concerned about a MCL tear plus medial meniscus plus possible ACL re-tear.  I would like to get a MRI of the knee asap.  I have discussed this with the patient.    Pain medicine given.  Precautions discussed.  He will need approval by his insurance for MRI.  I will see him back after MRI.  Call if any problem.  Electronically Signed Darreld Mclean, MD 6/1/20173:54 PM

## 2015-11-13 NOTE — Patient Instructions (Addendum)
Continue current medications.  Precautions required. Continue knee immobilizer and use crutches to reduce weight bearing.  Return after MRI.

## 2015-11-17 ENCOUNTER — Ambulatory Visit (HOSPITAL_COMMUNITY)
Admission: RE | Admit: 2015-11-17 | Discharge: 2015-11-17 | Disposition: A | Discharge status: Home / Self Care | Payer: 59 | Source: Ambulatory Visit | Attending: Orthopaedic Surgery | Admitting: Orthopaedic Surgery

## 2015-11-17 DIAGNOSIS — M948X6 Other specified disorders of cartilage, lower leg: Secondary | ICD-10-CM | POA: Insufficient documentation

## 2015-11-17 DIAGNOSIS — M25561 Pain in right knee: Secondary | ICD-10-CM | POA: Diagnosis not present

## 2015-11-17 DIAGNOSIS — S83231A Complex tear of medial meniscus, current injury, right knee, initial encounter: Secondary | ICD-10-CM | POA: Insufficient documentation

## 2015-11-19 ENCOUNTER — Encounter: Payer: Self-pay | Admitting: Orthopaedic Surgery

## 2015-11-19 ENCOUNTER — Ambulatory Visit (INDEPENDENT_AMBULATORY_CARE_PROVIDER_SITE_OTHER): Payer: Managed Care, Other (non HMO) | Admitting: Orthopaedic Surgery

## 2015-11-19 VITALS — BP 109/65 | HR 71 | Temp 98.1°F | Resp 16 | Ht 69.0 in | Wt 174.0 lb

## 2015-11-19 DIAGNOSIS — M25561 Pain in right knee: Secondary | ICD-10-CM

## 2015-11-19 MED ORDER — OXYCODONE-ACETAMINOPHEN 7.5-325 MG PO TABS: ORAL_TABLET | ORAL | Status: DC

## 2015-11-19 NOTE — Patient Instructions (Signed)
Return to see Dr. Romeo AppleHarrison for possible surgery.

## 2015-11-19 NOTE — Progress Notes (Signed)
Patient Brian Phelps, male DOB:March 21, 1971, 45 y.o. WUJ:[id number]  Chief Complaint  Patient presents with  . Follow-up    My right leg still hurts about the same.    HPI  Brian Phelps is a 45 y.o. male who has continued right knee pain with giving way.  He returns after MRI done which shows:  IMPRESSION: 1. Flap tear along the inferior surface of the body of the medial meniscus flipped into the inferior gutter. Oblique tear of the posterior horn of the medial meniscus extending to the inferior articular surface. 2. Large joint effusion. 3. Partial-thickness cartilage loss of the medial femoral condyle and medial tibial plateau with subchondral reactive marrow edema in the periphery of the medial tibial plateau. 4. Partial thickness cartilage loss the lateral patellar facet. Chondromalacia of the medial patellar facet.  I have explained the findings to him.  I have recommended arthroscopy of the right knee.  I will have him see Dr. Romeo Apple for this.  I went over the risks and imponderables.  HPI  Body mass index is 25.68 kg/(m^2).  ROS  Review of Systems  HENT: Negative for congestion.   Respiratory: Negative for cough and shortness of breath.   Cardiovascular: Negative for chest pain and leg swelling.  Endocrine: Negative for cold intolerance.  Musculoskeletal: Positive for joint swelling, arthralgias and gait problem.  Allergic/Immunologic: Negative for environmental allergies.  Neurological: Negative for numbness.    Past Medical History  Diagnosis Date  . Ulcerative colitis [facility])     Past Surgical History  Procedure Laterality Date  . Prostate surgery    . Ankle surgery    . Knee surgery    . Hernia repair    . Back surgery      History reviewed. No pertinent family history.  Social History Social History  Substance Use Topics  . Smoking status: Current Every Day Smoker -- 0.50 packs/day    Types: Cigarettes  . Smokeless tobacco: None  .  Alcohol Use: Yes    Allergies  Allergen Reactions  . Vancomycin     Current Outpatient Prescriptions  Medication Sig Dispense Refill  . ibuprofen (ADVIL,MOTRIN) 200 MG tablet Take 800 mg by mouth every 6 (six) hours as needed for moderate pain.    Marland Kitchen oxyCODONE-acetaminophen (PERCOCET) 7.5-325 MG tablet One tablet every four hours for pain.  Must last 15 days.  Do not operate automobile when taking medicine. 60 tablet 0   No current facility-administered medications for this visit.     Physical Exam  Blood pressure 109/65, pulse 71, temperature 98.1 F (36.7 C), resp. rate 16, height  (1.753 m), weight 174 lb (78.926 kg).  Constitutional: overall normal hygiene, normal nutrition, well developed, normal grooming, normal body habitus. Assistive device: Knee sleeve  Musculoskeletal: gait and station Limp right, muscle tone and strength are normal, no tremors or atrophy is present.  .  Neurological: coordination overall normal.  Deep tendon reflex/nerve stretch intact.  Sensation normal.  Cranial nerves II-XII intact.   Skin:   normal overall no scars, lesions, ulcers or rashes. No psoriasis.  Psychiatric: Alert and oriented x 3.  Recent memory intact, remote memory unclear.  Normal mood and affect. Well groomed.  Good eye contact.  Cardiovascular: overall no swelling, no varicosities, no edema bilaterally, normal temperatures of the legs and arms, no clubbing, cyanosis and good capillary refill.  Lymphatic: palpation is normal. The right lower extremity is examined:  Inspection:  Thigh:  Non-tender and no defects  Knee  has swelling 1+ effusion.                        Joint tenderness is present                        Patient is tender over the medial joint line  Lower Leg:  Has normal appearance and no tenderness or defects  Ankle:  Non-tender and no defects  Foot:  Non-tender and no defects Range of Motion:  Knee:  Range of motion is: 0-105                         Crepitus is  present  Ankle:  Range of motion is normal. Strength and Tone:  The right lower extremity has normal strength and tone. Stability:  Knee:  The knee is positive for medial McMurray.  Ankle:  The ankle is stable.     The patient has been educated about the nature of the problem(s) and counseled on treatment options.  The patient appeared to understand what I have discussed and is in agreement with it.  Encounter Diagnosis  Name Primary?  . Right knee pain Yes    PLAN Call if any problems.  Precautions discussed.  Continue current medications.   Return to clinic to see Dr. Romeo AppleHarrison.   Electronically Signed Darreld McleanWayne Adaeze Better, MD 6/7/20178:32 AM

## 2015-11-24 ENCOUNTER — Telehealth: Payer: Self-pay | Admitting: Orthopaedic Surgery

## 2015-11-24 ENCOUNTER — Encounter: Payer: Self-pay | Admitting: Orthopaedic Surgery

## 2015-11-24 NOTE — Telephone Encounter (Signed)
Called back to patient, notified that work note ready for pick-up.

## 2015-11-24 NOTE — Telephone Encounter (Signed)
Patient called to request a note for employer in order for him to "keep working."   He has an upcoming appointment with Dr. 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  Please advise.  Patient's ph#[phone]

## 2015-11-24 NOTE — Telephone Encounter (Signed)
OK.  He may work as long as no climbing or on heights and no prolonged walking or standing.  He has a knee problem and needs to see Dr. Romeo AppleHarrison to schedule surgery.

## 2015-11-27 ENCOUNTER — Telehealth: Payer: Self-pay | Admitting: Orthopaedic Surgery

## 2015-11-27 ENCOUNTER — Encounter: Payer: Self-pay | Admitting: Orthopaedic Surgery

## 2015-11-27 NOTE — Telephone Encounter (Signed)
OK.  Full duty note.  He has to be careful.

## 2015-11-27 NOTE — Telephone Encounter (Signed)
Patient is asking for a note to return to work full duty. He says that he can't go on work site with out one stating he can return full duty.

## 2015-11-27 NOTE — Telephone Encounter (Signed)
Brian Phelps's employer's office called because of the work note that he requested this morning. They are worried about him wanting to return to work full duty. They don't understand how he could  go from needing surgery to return to work full duty. I explained to the lady that he had requested the note this morning and that he does have an appointment on Monday, December 01, 2015 to be seen by Dr. Romeo AppleHarrison. She said that they would feel better on waiting to see what is said on Monday.

## 2015-12-01 ENCOUNTER — Ambulatory Visit (INDEPENDENT_AMBULATORY_CARE_PROVIDER_SITE_OTHER): Payer: Managed Care, Other (non HMO) | Admitting: Orthopedic Surgery

## 2015-12-01 ENCOUNTER — Encounter: Payer: Self-pay | Admitting: Orthopedic Surgery

## 2015-12-01 VITALS — BP 101/71 | HR 86 | Ht 69.0 in | Wt 169.0 lb

## 2015-12-01 DIAGNOSIS — S83241A Other tear of medial meniscus, current injury, right knee, initial encounter: Secondary | ICD-10-CM | POA: Diagnosis not present

## 2015-12-01 DIAGNOSIS — M1711 Unilateral primary osteoarthritis, right knee: Secondary | ICD-10-CM | POA: Diagnosis not present

## 2015-12-01 NOTE — Patient Instructions (Addendum)
Call us if the pain gets worse and then we will arrange for  Arthroscopy right knee partial medial meniscectomy   You have decided to proceed with operative arthroscopy of the knee. You have decided not to continue with nonoperative measures such as but not limited to oral medication, weight loss, activity modification, physical therapy, bracing, or injection.  We will perform operative arthroscopy of the knee. Some of the risks associated with arthroscopic surgery of the knee include but are not limited to Bleeding Infection Swelling Stiffness Blood clot Pain  If you're not comfortable with these risks and would like to continue with nonoperative treatment please let Dr. Romeo AppleHarrison know prior to your surgery.

## 2015-12-01 NOTE — Progress Notes (Signed)
Chief Complaint  Patient presents with  . Follow-up    Right knee, possibly discuss surgery, patient of Brian Phelps   HPI 45 year old male was injured sometime back in beginning of June. Presented to Dr. Hilda Phelps with 6 out of 10 burning aching pain in the right knee which did not improve after elevation, ice, immobilization, NSAID, nonweightbearing and rest. Has a history of anterior cruciate ligament reconstruction 7 years ago. MRI was obtained shows torn medial meniscus cartilage loss in the medial compartment an intact anterior cruciate ligament  He presents for possible evaluation for surgery  ROS   Review of Systems (note from 11/13/2015 Brian Phelps note) HENT: Negative for congestion.  Respiratory: Negative for cough and shortness of breath.  Cardiovascular: Negative for chest pain and leg swelling.  Endocrine: Negative for cold intolerance.  Musculoskeletal: Positive for joint swelling, arthralgias and gait problem.  Allergic/Immunologic: Negative for environmental allergies.  Neurological: Negative for tingling and numbness.   Update no chest pain or shortness of breath  Past Medical History  Diagnosis Date  . Ulcerative colitis [facility])     Past Surgical History  Procedure Laterality Date  . Prostate surgery    . Ankle surgery    . Knee surgery    . Hernia repair    . Back surgery     Family history no history of anesthetic problems or bleeding problems   Social History  Substance Use Topics  . Smoking status: Current Every Day Smoker -- 0.50 packs/day    Types: Cigarettes  . Smokeless tobacco: Not on file  . Alcohol Use: Yes    Current outpatient prescriptions:  .  ibuprofen (ADVIL,MOTRIN) 200 MG tablet, Take 800 mg by mouth every 6 (six) hours as needed for moderate pain., Disp: , Rfl:  .  oxyCODONE-acetaminophen (PERCOCET) 7.5-325 MG tablet, One tablet every four hours for pain.  Must last 15 days.  Do not operate automobile when taking medicine., Disp:  60 tablet, Rfl: 0  BP 101/71 mmHg  Pulse 86  Ht  (1.753 m)  Wt 169 lb (76.658 kg)  BMI 24.95 kg/m2  Physical Exam  Constitutional: He is oriented to person, place, and time. He appears well-developed and well-nourished. No distress.  Cardiovascular: Normal rate and intact distal pulses.   Musculoskeletal:       Right knee: He exhibits no effusion.       Left knee: He exhibits no effusion.  Neurological: He is alert and oriented to person, place, and time.  Skin: Skin is warm and dry. No rash noted. He is not diaphoretic. No erythema. No pallor.  Psychiatric: He has a normal mood and affect. His behavior is normal. Judgment and thought content normal.    Right Knee Exam   Tenderness  The patient is experiencing tenderness in the medial joint line.  Range of Motion  Extension: normal  Flexion: normal   Muscle Strength   The patient has normal right knee strength.  Tests  Lachman:  Anterior - negative    Posterior - negative Drawer:       Anterior - negative    Posterior - negative  Other  Erythema: absent Scars: present Sensation: normal Pulse: present Swelling: none Other tests: no effusion present   Left Knee Exam   Tenderness  The patient is experiencing no tenderness.     Range of Motion  Extension: normal  Flexion: normal   Muscle Strength   The patient has normal left knee strength.  Tests  Lachman:  Anterior - negative    Posterior - negative Drawer:       Anterior - negative     Posterior - negative Varus: negative Valgus: negative  Other  Erythema: absent Scars: absent Sensation: normal Pulse: present Swelling: none Effusion: no effusion present     Gait normal  ASSESSMENT: My personal interpretation of the images:  I agrees anterior cruciate ligament is intact after looking at the MRI I do also see the torn medial meniscus  MRI report  IMPRESSION: 1. Flap tear along the inferior surface of the body of the  medial meniscus flipped into the inferior gutter. Oblique tear of the posterior horn of the medial meniscus extending to the inferior articular surface. 2. Large joint effusion. 3. Partial-thickness cartilage loss of the medial femoral condyle and medial tibial plateau with subchondral reactive marrow edema in the periphery of the medial tibial plateau. 4. Partial thickness cartilage loss the lateral patellar facet. Chondromalacia of the medial patellar facet.     Electronically Signed   By: Brian KoHetal  Phelps   On: 11/18/2015 08:04   PLAN The patient says that he has gotten better and he has some discomfort there but he can live with it. We decided that if his knee gets worse as long as there is no other injury he can call us and we can arrange to surgery by phone for arthroscopy and medial meniscectomy  Brian CanadaStanley Lattanzio, MD 12/01/2015 1:53 PM

## 2015-12-17 ENCOUNTER — Telehealth: Payer: Self-pay | Admitting: Orthopaedic Surgery

## 2015-12-17 MED ORDER — OXYCODONE-ACETAMINOPHEN 7.5-325 MG PO TABS: ORAL_TABLET | ORAL | Status: DC

## 2015-12-17 NOTE — Telephone Encounter (Signed)
Rx done. 

## 2015-12-17 NOTE — Telephone Encounter (Signed)
Patient called and requested a refill on Percocet 7.5-325 mgs.  Qty  60    Sig: One tablet every four hours for pain. Must last 15 days. Do not operate automobile when taking medicine.

## 2015-12-31 ENCOUNTER — Telehealth: Payer: Self-pay | Admitting: Orthopaedic Surgery

## 2015-12-31 MED ORDER — OXYCODONE-ACETAMINOPHEN 5-325 MG PO TABS
1.0000 | ORAL_TABLET | ORAL | Status: AC | PRN
Start: 2015-12-31 — End: ?

## 2015-12-31 NOTE — Telephone Encounter (Signed)
Rx Done . 

## 2015-12-31 NOTE — Telephone Encounter (Signed)
Patient called for refill of: oxyCODONE-acetaminophen (PERCOCET) 7.5-325 MG tablet - quantity 60.

## 2016-02-26 ENCOUNTER — Telehealth: Payer: Self-pay | Admitting: Orthopedic Surgery

## 2016-02-26 NOTE — Telephone Encounter (Signed)
Patient requested refill on Oxycodone/Acetaminophen (Percocet) 5-325 mgs.  Qty  60  Sig: Take 1 tablet by mouth every 4 (four) hours as needed for moderate pain or severe pain (Must last 15 days.Do not drive or operate machinery while taking this medicine).

## 2016-02-27 NOTE — Telephone Encounter (Signed)
Routing to Dr Ander for approval 

## 2016-02-27 NOTE — Telephone Encounter (Signed)
Declined

## 2018-05-03 IMAGING — MR MR KNEE*R* W/O CM
4 of 6 series · 13 of 40 positions shown · non-contrast
Comparison: None.

CLINICAL DATA: Right knee pain for 1 month. Twisting injury 1 month
ago.

EXAM:
MRI OF THE RIGHT KNEE WITHOUT CONTRAST
TECHNIQUE: Multiplanar, multisequence MR imaging of the knee was performed. No
intravenous contrast was administered.

[Series 3: pdfs axial · axial · 3.0mm · 0.20mm/px · z∈[-60,+12]mm · 3 of 30 slices shown]
[im 5/30]
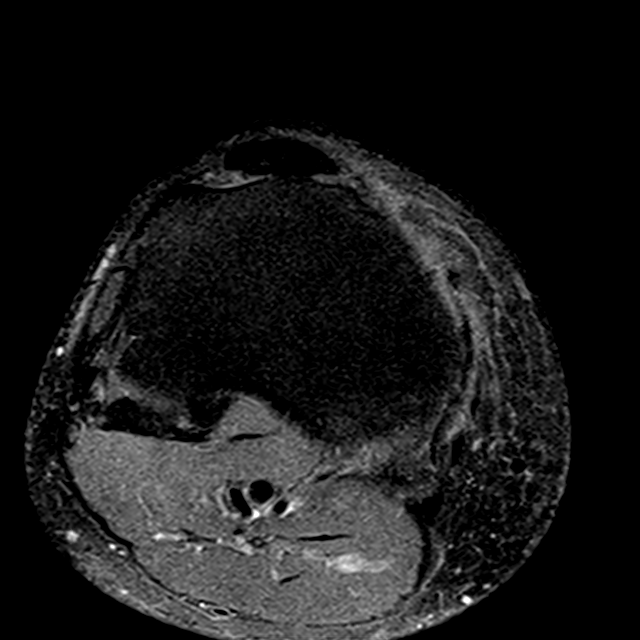
[im 17/30]
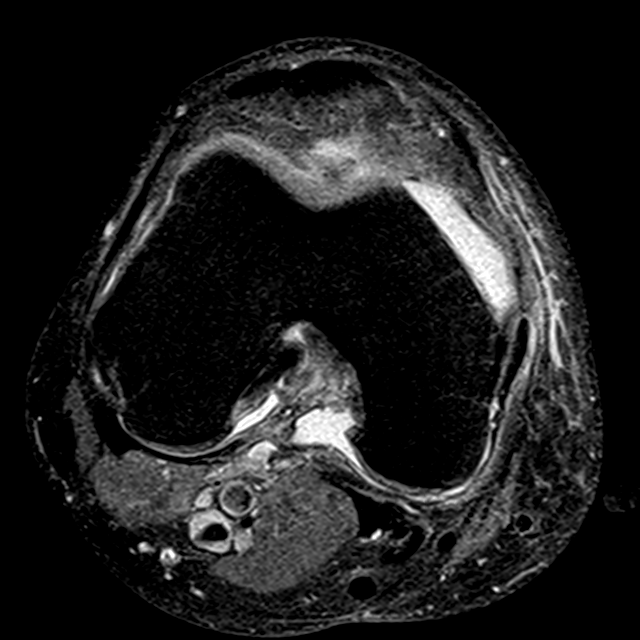
[im 25/30]
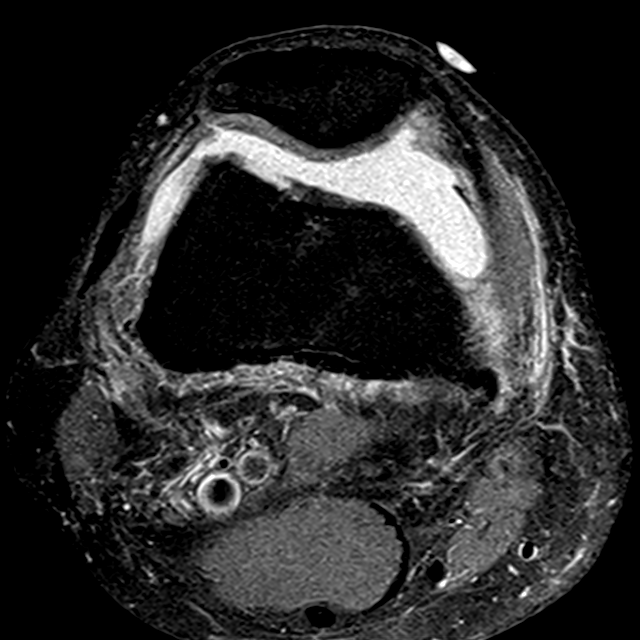

[Series 4: T1 · coronal · 3.0mm · 0.20mm/px · 4 of 28 slices shown]
[im 1/28]
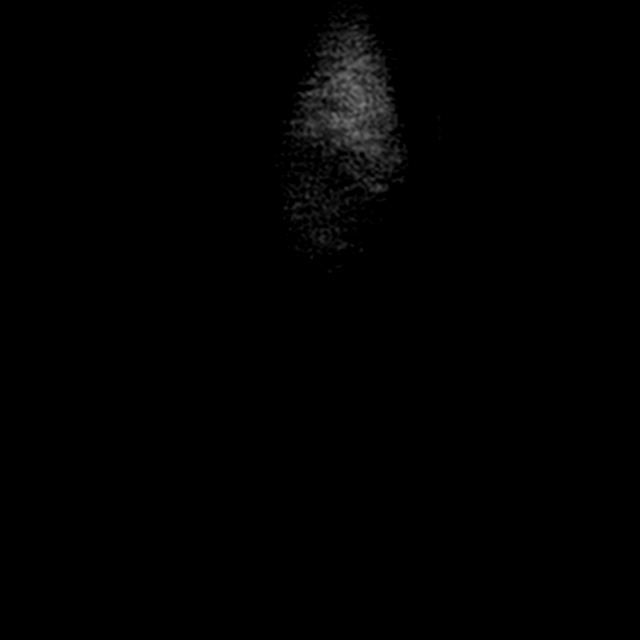
[im 5/28]
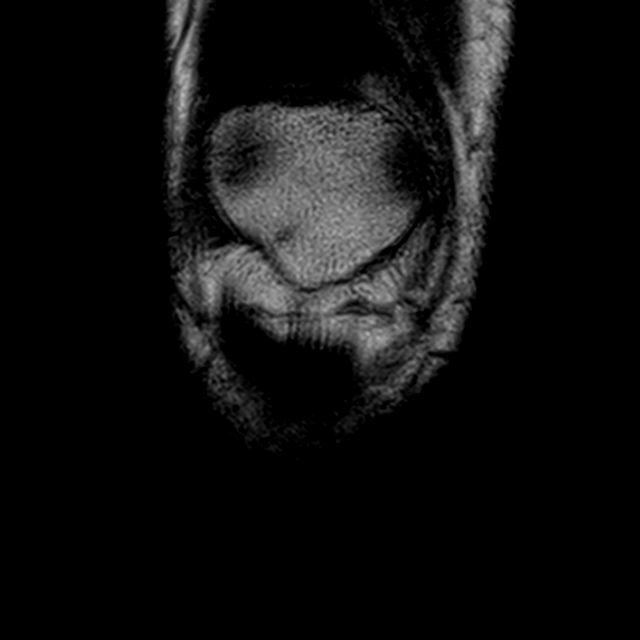
[im 14/28]
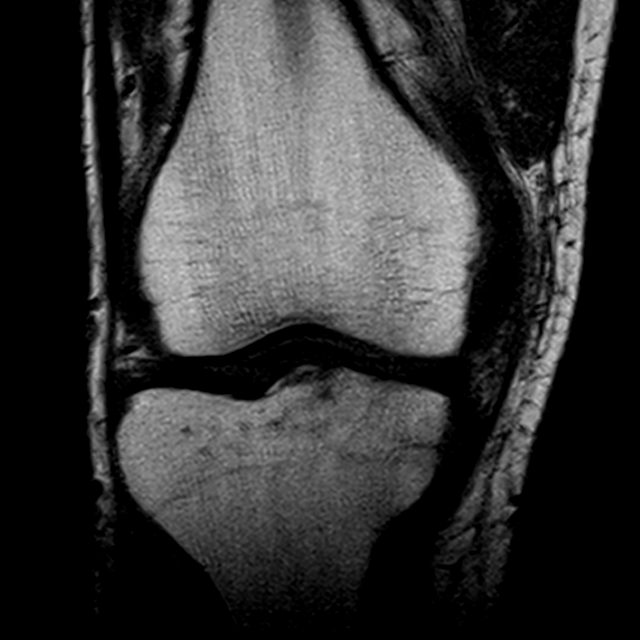
[im 23/28]
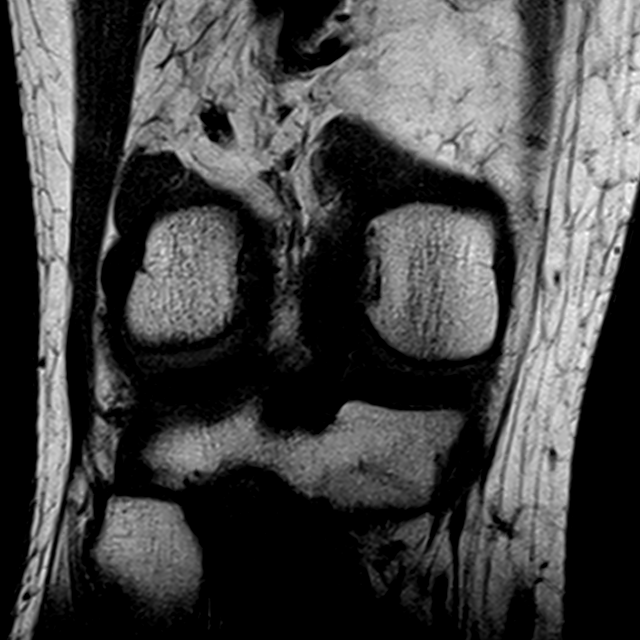

[Series 5: pdfs sag · sagittal · 3.0mm · 0.22mm/px · 3 of 29 slices shown]
[im 5/29]
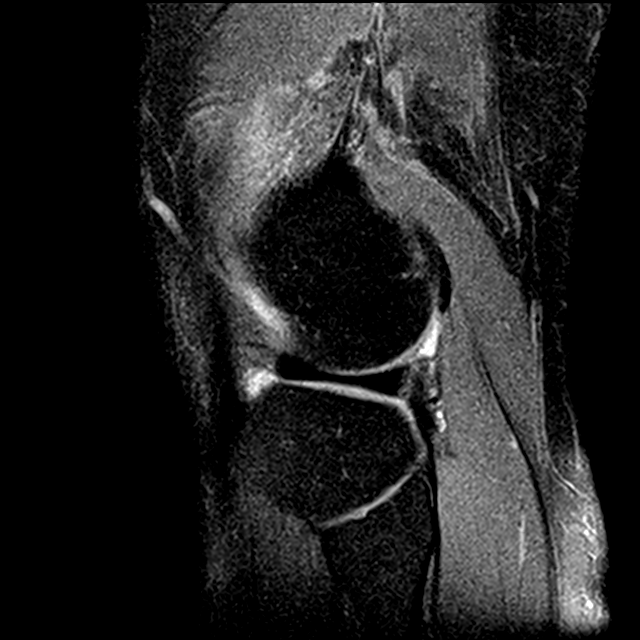
[im 15/29]
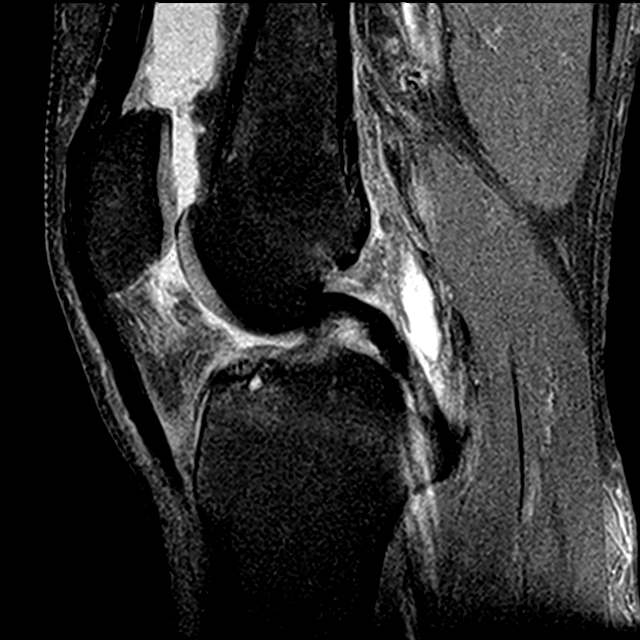
[im 24/29]
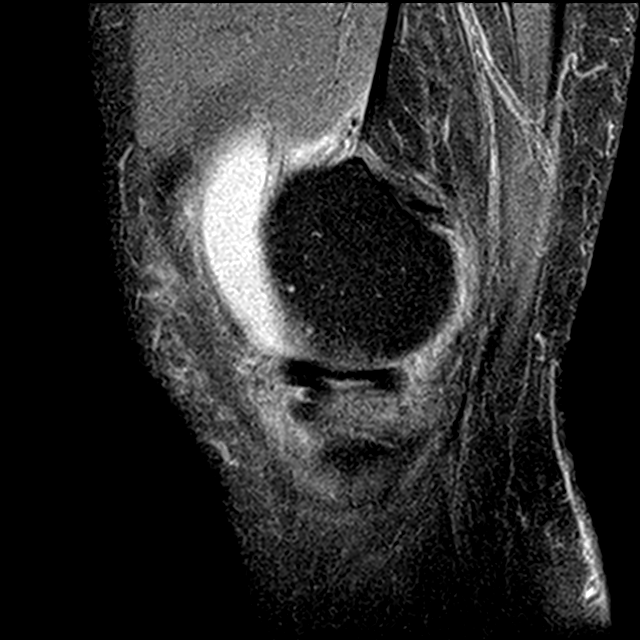

[Series 6: t2fs cor · coronal · 3.0mm · 0.22mm/px · 3 of 28 slices shown]
[im 5/28]
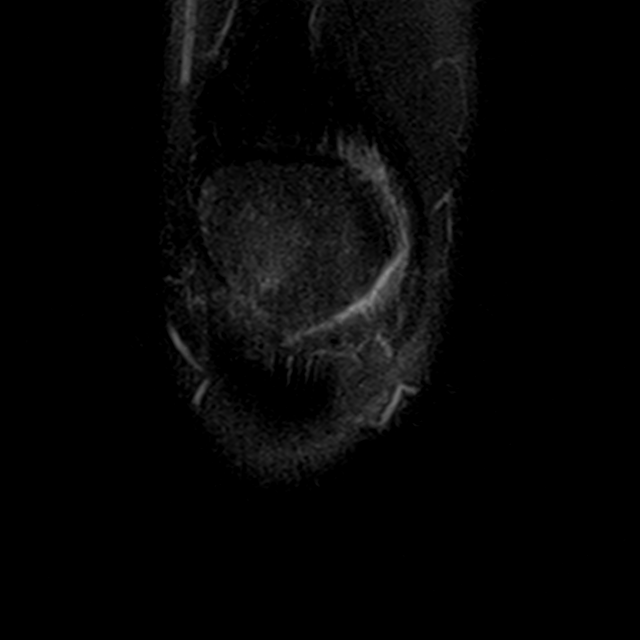
[im 14/28]
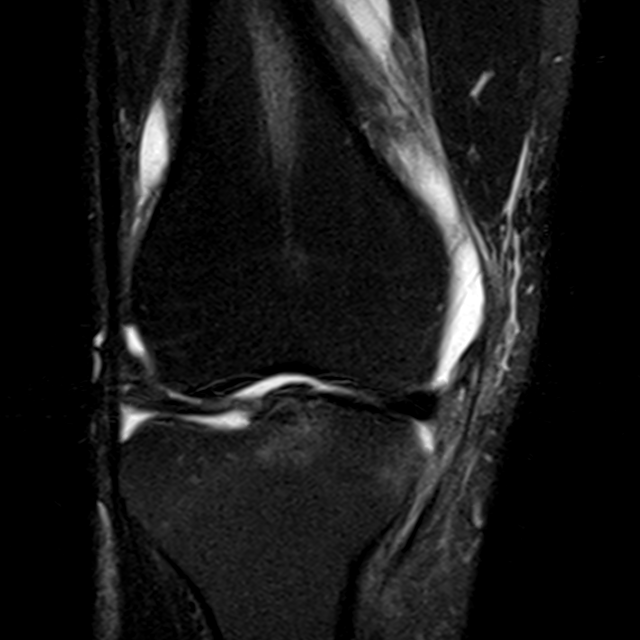
[im 23/28]
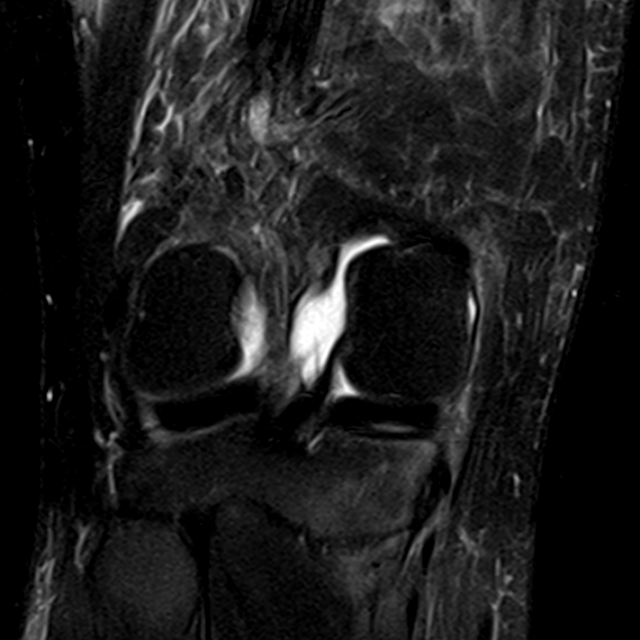

[13 of 40 positions shown; findings below may reference images not displayed]

FINDINGS: MENISCI

Medial meniscus: Flap tear along the inferior surface of the body of
the medial meniscus flipped into the inferior gutter. Oblique tear
of the posterior horn of the medial meniscus extending to the
inferior articular surface.

Lateral meniscus:  Intact.

LIGAMENTS

Cruciates:  Intact ACL and PCL.

Collaterals: Medial collateral ligament is intact. Lateral
collateral ligament complex is intact.

CARTILAGE

Patellofemoral: Partial thickness cartilage loss the lateral
patellar facet. Chondromalacia of the medial patellar facet.

Medial: Partial-thickness cartilage loss of the medial femoral
condyle and medial tibial plateau with subchondral reactive marrow
edema in the periphery of the medial tibial plateau.

Lateral:  No chondral defect.

Joint: Large joint effusion. Edema in Hoffa's fat. No plical
thickening.

Popliteal Fossa:  No Baker cyst.  Intact popliteus tendon.

Extensor Mechanism:  Intact quadriceps tendon and patellar tendon.

Bones:  No marrow signal abnormality.  No fracture or dislocation.
IMPRESSION: 1. Flap tear along the inferior surface of the body of the medial
meniscus flipped into the inferior gutter. Oblique tear of the
posterior horn of the medial meniscus extending to the inferior
articular surface.
2. Large joint effusion.
3. Partial-thickness cartilage loss of the medial femoral condyle
and medial tibial plateau with subchondral reactive marrow edema in
the periphery of the medial tibial plateau.
4. Partial thickness cartilage loss the lateral patellar facet.
Chondromalacia of the medial patellar facet.

## 2020-12-12 ENCOUNTER — Inpatient Hospital Stay: Admit: 2020-12-12 | Primary: Sports Medicine

## 2020-12-12 ENCOUNTER — Inpatient Hospital Stay
Admit: 2020-12-12 | Discharge: 2020-12-12 | Disposition: A | Discharge status: Home / Self Care | Attending: Emergency Medicine

## 2020-12-12 DIAGNOSIS — K922 Gastrointestinal hemorrhage, unspecified: Secondary | ICD-10-CM

## 2020-12-12 LAB — CBC WITH AUTO DIFFERENTIAL
Absolute Eos #: 0.2 10*3/uL (ref 0.0–0.8)
Absolute Immature Granulocyte: 0 10*3/uL (ref 0.0–0.5)
Absolute Lymph #: 3.4 10*3/uL (ref 0.5–4.6)
Absolute Mono #: 0.7 10*3/uL (ref 0.1–1.3)
Basophils Absolute: 0.1 10*3/uL (ref 0.0–0.2)
Basophils: 1 % (ref 0.0–2.0)
Eosinophils %: 2 % (ref 0.5–7.8)
Hematocrit: 40.6 % — ABNORMAL LOW (ref 41.1–50.3)
Hemoglobin: 13.2 g/dL — ABNORMAL LOW (ref 13.6–17.2)
Immature Granulocytes: 0 % (ref 0.0–5.0)
Lymphocytes: 39 % (ref 13–44)
MCH: 30.8 PG (ref 26.1–32.9)
MCHC: 32.5 g/dL (ref 31.4–35.0)
MCV: 94.9 FL (ref 79.6–97.8)
MPV: 10 FL (ref 9.4–12.3)
Monocytes: 8 % (ref 4.0–12.0)
Platelets: 363 10*3/uL (ref 150–450)
RBC: 4.28 M/uL (ref 4.23–5.6)
RDW: 14.6 % (ref 11.9–14.6)
Seg Neutrophils: 51 % (ref 43–78)
Segs Absolute: 4.4 10*3/uL (ref 1.7–8.2)
WBC: 8.8 10*3/uL (ref 4.3–11.1)
nRBC: 0 10*3/uL (ref 0.0–0.2)

## 2020-12-12 LAB — COMPREHENSIVE METABOLIC PANEL
ALT: 144 U/L — ABNORMAL HIGH (ref 12–65)
AST: 41 U/L — ABNORMAL HIGH (ref 15–37)
Albumin/Globulin Ratio: 0.8 — ABNORMAL LOW (ref 1.2–3.5)
Albumin: 3.3 g/dL — ABNORMAL LOW (ref 3.5–5.0)
Alk Phosphatase: 211 U/L — ABNORMAL HIGH (ref 50–136)
Anion Gap: 7 mmol/L (ref 7–16)
BUN: 13 MG/DL (ref 6–23)
CO2: 27 mmol/L (ref 21–32)
Calcium: 9.4 MG/DL (ref 8.3–10.4)
Chloride: 109 mmol/L — ABNORMAL HIGH (ref 98–107)
Creatinine: 0.87 MG/DL (ref 0.8–1.5)
GFR African American: >60 mL/min/{1.73_m2} (ref >60)
GFR Non-African American: >60 mL/min/{1.73_m2} (ref >60)
Globulin: 4.1 g/dL — ABNORMAL HIGH (ref 2.3–3.5)
Glucose: 104 mg/dL — ABNORMAL HIGH (ref 65–100)
Potassium: 4.2 mmol/L (ref 3.5–5.1)
Sodium: 143 mmol/L (ref 136–145)
Total Bilirubin: 0.5 MG/DL (ref 0.2–1.1)
Total Protein: 7.4 g/dL (ref 6.3–8.2)

## 2020-12-12 LAB — EKG 12-LEAD
Atrial Rate: 100 {beats}/min
Diagnosis: NORMAL
P Axis: 77 degrees
P-R Interval: 136 ms
Q-T Interval: 352 ms
QRS Duration: 94 ms
QTc Calculation (Bazett): 454 ms
R Axis: 85 degrees
T Axis: 63 degrees
Ventricular Rate: 100 {beats}/min

## 2020-12-12 LAB — PROTIME-INR
INR: 0.9
Protime: 12.7 s (ref 12.6–14.5)

## 2020-12-12 LAB — TYPE AND SCREEN
ABO/Rh: O NEG
Antibody Screen: NEGATIVE

## 2020-12-12 MED ORDER — METRONIDAZOLE 500 MG PO TABS
500 MG | ORAL_TABLET | Freq: Three times a day (TID) | ORAL | 0 refills | Status: AC
Start: 2020-12-12 — End: 2020-12-19

## 2020-12-12 MED ORDER — NORMAL SALINE FLUSH 0.9 % IV SOLN
0.9 % | Freq: Two times a day (BID) | INTRAVENOUS | Status: DC
Start: 2020-12-12 — End: 2020-12-16

## 2020-12-12 MED ORDER — SODIUM CHLORIDE 0.9 % IV BOLUS
0.9 % | Freq: Once | INTRAVENOUS | Status: DC
Start: 2020-12-12 — End: 2020-12-16

## 2020-12-12 MED ORDER — SULFAMETHOXAZOLE-TRIMETHOPRIM 800-160 MG PO TABS
800-160 MG | ORAL_TABLET | Freq: Two times a day (BID) | ORAL | 0 refills | Status: AC
Start: 2020-12-12 — End: 2020-12-19

## 2020-12-12 MED ORDER — ONDANSETRON 4 MG PO TBDP
4 MG | ORAL_TABLET | Freq: Three times a day (TID) | ORAL | 0 refills | Status: AC | PRN
Start: 2020-12-12 — End: ?

## 2020-12-12 MED ORDER — HYOSCYAMINE SULFATE SL 0.125 MG SL SUBL
0.125 MG | Freq: Four times a day (QID) | SUBLINGUAL | 0 refills | Status: AC | PRN
Start: 2020-12-12 — End: ?

## 2020-12-12 MED ORDER — NORMAL SALINE FLUSH 0.9 % IV SOLN
0.9 % | Freq: Three times a day (TID) | INTRAVENOUS | Status: DC
Start: 2020-12-12 — End: 2020-12-12
  Administered 2020-12-12: 12:00:00 3 mL via INTRAVENOUS

## 2020-12-12 MED ORDER — KETOROLAC TROMETHAMINE 15 MG/ML IJ SOLN
15 MG/ML | INTRAMUSCULAR | Status: AC
Start: 2020-12-12 — End: 2020-12-12
  Administered 2020-12-12: 12:00:00 15 mg via INTRAVENOUS

## 2020-12-12 MED ORDER — IOPAMIDOL 76 % IV SOLN
76 % | Freq: Once | INTRAVENOUS | Status: AC | PRN
Start: 2020-12-12 — End: 2020-12-12
  Administered 2020-12-12: 13:00:00 100 mL via INTRAVENOUS

## 2020-12-12 MED ORDER — HYOSCYAMINE SULFATE 0.125 MG SL SUBL
125 MCG | SUBLINGUAL | Status: AC
Start: 2020-12-12 — End: 2020-12-12
  Administered 2020-12-12: 12:00:00 250 ug via SUBLINGUAL

## 2020-12-12 MED ORDER — ONDANSETRON HCL 4 MG/2ML IJ SOLN
4 MG/2ML | INTRAMUSCULAR | Status: AC
Start: 2020-12-12 — End: 2020-12-12
  Administered 2020-12-12: 12:00:00 4 mg via INTRAVENOUS

## 2020-12-12 MED ORDER — DIATRIZOATE MEGLUMINE & SODIUM 66-10 % PO SOLN
66-10 % | ORAL | Status: AC
Start: 2020-12-12 — End: 2020-12-12
  Administered 2020-12-12: 12:00:00 15 mL via ORAL

## 2020-12-12 MED ORDER — SODIUM CHLORIDE 0.9 % IV SOLN
0.9 % | INTRAVENOUS | Status: DC
Start: 2020-12-12 — End: 2020-12-12
  Administered 2020-12-12: 12:00:00 via INTRAVENOUS

## 2020-12-12 MED FILL — MD-GASTROVIEW 66-10 % PO SOLN: 66-10 % | ORAL | Qty: 30

## 2020-12-12 MED FILL — HYOSCYAMINE SULFATE 0.125 MG SL SUBL: 0.125 mg | SUBLINGUAL | Qty: 2

## 2020-12-12 MED FILL — KETOROLAC TROMETHAMINE 15 MG/ML IJ SOLN: 15 mg/mL | INTRAMUSCULAR | Qty: 1

## 2020-12-12 MED FILL — ISOVUE-370 76 % IV SOLN: 76 % | INTRAVENOUS | Qty: 100

## 2020-12-12 MED FILL — ONDANSETRON HCL 4 MG/2ML IJ SOLN: 4 MG/2ML | INTRAMUSCULAR | Qty: 2

## 2020-12-12 NOTE — ED Triage Notes (Signed)
Patient started with right lower abdominal pain sharp in nature 3 days ago, with nausea. Patient advises 2 days ago he noticed he was passing blood with dark clot present. Patient also with skin abscess to right elbow that started draining on its own. Patient advises he has had GI bleed before but states related to staph infection. Masked.

## 2020-12-12 NOTE — Discharge Instructions (Signed)
Start antibiotics.  Clean right elbow with soap and water.  Antibiotic ointment and gauze dressing.  Take antibiotic until complete.  Medications for nausea and pain if needed in your abdomen.  Call gastroenterology doctors Monday if you do not hear from them by Monday morning regarding follow-up appointment.  Recheck sooner for worse bleeding, high fever or worse pain.

## 2020-12-12 NOTE — ED Notes (Signed)
Oral contrast completed, CT notified.     Irene Limbo, RN  12/12/20 [phone]

## 2020-12-12 NOTE — ED Notes (Signed)
Dr. Irving Copas advised that we did not need to start another IV at this time that he was going to be putting patient up for discharge.      Lewayne Bunting, RN  12/12/20 1009

## 2020-12-12 NOTE — ED Notes (Signed)
I have reviewed discharge instructions with the patient.  The patient verbalized understanding.    Patient left ED via Discharge Method: ambulatory to Home with self.    Opportunity for questions and clarification provided.       Patient given 4 scripts.         To continue your aftercare when you leave the hospital, you may receive an automated call from our care team to check in on how you are doing.  This is a free service and part of our promise to provide the best care and service to meet your aftercare needs.??? If you have questions, or wish to unsubscribe from this service please call [phone].  Thank you for Choosing our [facility] Emergency Department.      Irene Limbo, RN  12/12/20 1110

## 2020-12-12 NOTE — ED Provider Notes (Addendum)
[facility] Emergency Department Provider Note                   PCP:                None Provider               Age: 50 y.o.      Sex: male     No diagnosis found.    DISPOSITION         New Prescriptions    No medications on file       Orders Placed This Encounter   Procedures   ??? CT ABDOMEN PELVIS W IV CONTRAST Additional Contrast? Oral   ??? CBC with Auto Differential   ??? CMP   ??? Protime-INR   ??? Pulse Oximetry   ??? Cardiac Monitor - ED Only   ??? Orthostatic blood pressure and pulse   ??? CLEAN WOUND/ABRASION: SPECIFY   ??? EKG 12 Lead (Select if Upper Abdominal Pain or symptoms of SOB,or Tachycardia)   ??? TYPE AND SCREEN   ??? Saline lock IV        Dorothyann Peng, MD 7:48 AM      MDM  Number of Diagnoses or Management Options  Diagnosis management comments: 50 year old male rectal bleeding abdominal pain.  History of ulcerative colitis and prostate abscess.  Screening lab work.  IV fluids.  Pain control.  CT scan to assess for flare of ulcerative colitis, diverticulitis prostate issues.       Amount and/or Complexity of Data Reviewed  Clinical lab tests: ordered and reviewed  Tests in the radiology section of CPT??: ordered and reviewed  Review and summarize past medical records: yes (Hospitalization 2010 prostate abscess with surgery.  Staff)  Independent visualization of images, tracings, or specimens: yes (My interpretation of EKG shows sinus tachycardia at 100.  No ST-T changes.  No ectopy.  Normal QT interval.)    Risk of Complications, Morbidity, and/or Mortality  Presenting problems: moderate  Diagnostic procedures: low  Management options: low    Patient Progress  Patient progress: stable       [name] is a 50 y.o. male who presents to the Emergency Department with chief complaint of    Chief Complaint   Patient presents with   ??? Rectal Bleeding      50 year old male with some sharp lower abdominal pains, worse on the right for 3 days.  Coming and going.  No particular aggravating relieving factors.   Patient has noticed some dark color and occasionally lighter episodes of bleeding after bowel movements.  No rectal pain.  No dysuria or hematuria.  No  fever or chills.  No vomiting.  History of hernia surgery as well as prostate surgery.  Review of records reveals surgery 2010 for prostate abscess.  Patient states he feels he had some rectal bleeding then and similar symptoms to this.  Also states a history of colonoscopy in the past.  He thought he had ulcerative colitis and was on Asacol for a while    The history is provided by the patient.   Abdominal Pain  Pain location:  Suprapubic and RLQ  Pain quality: cramping and sharp    Pain radiates to:  Does not radiate  Pain severity:  Moderate  Onset quality:  Gradual  Duration:  3 days  Timing:  Intermittent  Progression:  Worsening  Chronicity:  New  Context: not sick contacts and not suspicious food intake  Relieved by:  Nothing  Worsened by:  Nothing  Ineffective treatments:  None tried  Associated symptoms: hematochezia    Associated symptoms: no chest pain, no chills, no cough, no diarrhea, no fever, no hematuria, no melena, no nausea, no shortness of breath and no vomiting          Review of Systems   Constitutional: Negative for chills and fever.   Respiratory: Negative for cough and shortness of breath.    Cardiovascular: Negative for chest pain.   Gastrointestinal: Positive for abdominal pain, blood in stool and hematochezia. Negative for diarrhea, melena, nausea and vomiting.   Genitourinary: Negative for difficulty urinating and hematuria.       Past Medical History:   Diagnosis Date   ??? COPD (chronic obstructive pulmonary disease) ([place])         Past Surgical History:   Procedure Laterality Date   ??? BACK SURGERY      herniation l4-l5   ??? HERNIA REPAIR     ??? ORTHOPEDIC SURGERY Right     Ankle fracture repair   ??? ORTHOPEDIC SURGERY Right     Baker's cyst        History reviewed. No pertinent family history.        Social Connections:    ??? Frequency of  Communication with Friends and Family: Not on file   ??? Frequency of Social Gatherings with Friends and Family: Not on file   ??? Attends Religious Services: Not on file   ??? Active Member of Clubs or Organizations: Not on file   ??? Attends Archivist Meetings: Not on file   ??? Marital Status: Not on file        Allergies   Allergen Reactions   ??? Vancomycin Hives        Vitals signs and nursing note reviewed.   Patient Vitals for the past 4 hrs:   Temp Pulse Resp BP SpO2   12/12/20 0712 98.3 ??F (36.8 ??C) (!) 127 16 110/69 100 %          Physical Exam  Vitals and nursing note reviewed.   Constitutional:       Appearance: He is not ill-appearing.   HENT:      Head: Normocephalic and atraumatic.      Right Ear: External ear normal.      Left Ear: External ear normal.      Mouth/Throat:      Mouth: Mucous membranes are moist.      Pharynx: Oropharynx is clear.   Eyes:      General: No scleral icterus.     Extraocular Movements: Extraocular movements intact.      Pupils: Pupils are equal, round, and reactive to light.   Cardiovascular:      Rate and Rhythm: Normal rate and regular rhythm.   Pulmonary:      Effort: Pulmonary effort is normal.      Breath sounds: Normal breath sounds.   Abdominal:      Palpations: Abdomen is soft.      Tenderness: There is abdominal tenderness in the right lower quadrant and suprapubic area. There is no rebound.   Musculoskeletal:         General: No swelling or tenderness.      Cervical back: Normal range of motion and neck supple.      Right lower leg: No edema.      Left lower leg: No edema.   Skin:  General: Skin is warm and dry.   Neurological:      General: No focal deficit present.      Mental Status: He is alert.          Procedures      Labs Reviewed   CBC WITH AUTO DIFFERENTIAL   COMPREHENSIVE METABOLIC PANEL   PROTIME-INR   TYPE AND SCREEN        CT ABDOMEN PELVIS W IV CONTRAST Additional Contrast? Oral    (Results Pending)                  Results Include:    Recent  Results (from the past 24 hour(s))   TYPE AND SCREEN    Collection Time: 12/12/20  7:41 AM   Result Value Ref Range    Crossmatch expiration date 12/15/2020,2359     ABO/Rh O NEGATIVE     Antibody Screen NEG    CBC with Auto Differential    Collection Time: 12/12/20  7:41 AM   Result Value Ref Range    WBC 8.8 4.3 - 11.1 K/uL    RBC 4.28 4.23 - 5.6 M/uL    Hemoglobin 13.2 (L) 13.6 - 17.2 g/dL    Hematocrit 40.6 (L) 41.1 - 50.3 %    MCV 94.9 79.6 - 97.8 FL    MCH 30.8 26.1 - 32.9 PG    MCHC 32.5 31.4 - 35.0 g/dL    RDW 14.6 11.9 - 14.6 %    Platelets 363 150 - 450 K/uL    MPV 10.0 9.4 - 12.3 FL    nRBC 0.00 0.0 - 0.2 K/uL    Differential Type AUTOMATED      Seg Neutrophils 51 43 - 78 %    Lymphocytes 39 13 - 44 %    Monocytes 8 4.0 - 12.0 %    Eosinophils % 2 0.5 - 7.8 %    Basophils 1 0.0 - 2.0 %    Immature Granulocytes 0 0.0 - 5.0 %    Segs Absolute 4.4 1.7 - 8.2 K/UL    Absolute Lymph # 3.4 0.5 - 4.6 K/UL    Absolute Mono # 0.7 0.1 - 1.3 K/UL    Absolute Eos # 0.2 0.0 - 0.8 K/UL    Basophils Absolute 0.1 0.0 - 0.2 K/UL    Absolute Immature Granulocyte 0.0 0.0 - 0.5 K/UL   CMP    Collection Time: 12/12/20  7:41 AM   Result Value Ref Range    Sodium 143 136 - 145 mmol/L    Potassium 4.2 3.5 - 5.1 mmol/L    Chloride 109 (H) 98 - 107 mmol/L    CO2 27 21 - 32 mmol/L    Anion Gap 7 7 - 16 mmol/L    Glucose 104 (H) 65 - 100 mg/dL    BUN 13 6 - 23 MG/DL    CREATININE 0.87 0.8 - 1.5 MG/DL    GFR African American >60 >60 ml/min/1.39m    GFR Non-African American >60 >60 ml/min/1.79m   Calcium 9.4 8.3 - 10.4 MG/DL    Total Bilirubin 0.5 0.2 - 1.1 MG/DL    ALT 144 (H) 12 - 65 U/L    AST 41 (H) 15 - 37 U/L    Alk Phosphatase 211 (H) 50 - 136 U/L    Total Protein 7.4 6.3 - 8.2 g/dL    Albumin 3.3 (L) 3.5 - 5.0 g/dL    Globulin 4.1 (H) 2.3 - 3.5 g/dL  Albumin/Globulin Ratio 0.8 (L) 1.2 - 3.5     Protime-INR    Collection Time: 12/12/20  7:41 AM   Result Value Ref Range    Protime 12.7 12.6 - 14.5 sec    INR 0.9     EKG 12  Lead (Select if Upper Abdominal Pain or symptoms of SOB,or Tachycardia)    Collection Time: 12/12/20  7:41 AM   Result Value Ref Range    Ventricular Rate 100 BPM    Atrial Rate 100 BPM    P-R Interval 136 ms    QRS Duration 94 ms    Q-T Interval 352 ms    QTc Calculation (Bazett) 454 ms    P Axis 77 degrees    R Axis 85 degrees    T Axis 63 degrees    Diagnosis Normal sinus rhythm      CT ABDOMEN PELVIS W IV CONTRAST Additional Contrast? Oral    Result Date: 12/12/2020  CT OF THE ABDOMEN AND PELVIS INDICATION: Right lower abdominal pain and bleeding. History of prostate abscess and ulcerative colitis. COMPARISON: None. TECHNIQUE: Multiple axial images were obtained through the abdomen and pelvis after intravenous injection of 100 mL Isovue 370. Intravenous contrast was used for better evaluation of solid organs and vascular structures. Oral contrast was used for bowel opacification. Radiation dose reduction techniques were used for this study. Our CT scanners use one or all of the following: Automated exposure control, adjustment of the mA and/or kV according to patient size, iterative reconstruction. FINDINGS: Visualized thorax: Normal. Liver: Normal in size and morphology.  No focal lesions. Gallbladder/biliary: Normal gallbladder. Mild intrahepatic biliary dilatation. Pancreas: Normal. Spleen: Normal. Adrenals: Normal. Kidneys: No focal lesion or hydronephrosis. Bladder: Normal. Pelvic organs: Unremarkable CT appearance of the prostate. Gastrointestinal: Mild wall thickening and adjacent stranding at the rectosigmoid junction. A normal appendix is identified. Peritoneum/retroperitoneum: Normal. Lymph nodes: Normal. Vessels: Normal. Bones/Soft tissues: Normal.     1.  No clear acute abnormality. There is, however, mild wall thickening and adjacent stranding at the rectosigmoid junction, which could reflect mild colitis. 2.  Nonspecific mild intrahepatic biliary dilation.       Given history of ulcerative colitis,  will refer to GI.  Antibiotics due to cellulitis and draining abscess right elbow.  We will add in Flagyl.     Spoke with GI.  Antibiotics.  Hold on AsaCOL until follow-up.   Voice dictation software was used during the making of this note.  This software is not perfect and grammatical and other typographical errors may be present.  This note has not been completely proofread for errors.     Dorothyann Peng, MD  12/12/20 3893       Dorothyann Peng, MD  12/12/20 7342       Dorothyann Peng, MD  12/12/20 1052

## 2021-03-29 ENCOUNTER — Inpatient Hospital Stay
Admit: 2021-03-29 | Discharge: 2021-03-30 | Disposition: A | Discharge status: Home / Self Care | Attending: Emergency Medicine

## 2021-03-29 DIAGNOSIS — L03221 Cellulitis of neck: Secondary | ICD-10-CM

## 2021-03-29 LAB — COMPREHENSIVE METABOLIC PANEL
ALT: 45 U/L (ref 12–65)
AST: 40 U/L — ABNORMAL HIGH (ref 15–37)
Albumin/Globulin Ratio: 0.9 (ref 0.4–1.6)
Albumin: 3.4 g/dL — ABNORMAL LOW (ref 3.5–5.0)
Alk Phosphatase: 71 U/L (ref 50–136)
Anion Gap: 4 mmol/L (ref 2–11)
BUN: 13 MG/DL (ref 6–23)
CO2: 25 mmol/L (ref 21–32)
Calcium: 8.3 MG/DL (ref 8.3–10.4)
Chloride: 106 mmol/L (ref 101–110)
Creatinine: 1.1 MG/DL (ref 0.8–1.5)
Est, Glom Filt Rate: >60 mL/min/{1.73_m2} (ref >60)
Globulin: 3.6 g/dL (ref 2.8–4.5)
Glucose: 208 mg/dL — ABNORMAL HIGH (ref 65–100)
Potassium: 4 mmol/L (ref 3.5–5.1)
Sodium: 135 mmol/L (ref 133–143)
Total Bilirubin: 0.4 MG/DL (ref 0.2–1.1)
Total Protein: 7 g/dL (ref 6.3–8.2)

## 2021-03-29 LAB — CBC
Hematocrit: 43.2 % (ref 41.1–50.3)
Hemoglobin: 14 g/dL (ref 13.6–17.2)
MCH: 33 PG — ABNORMAL HIGH (ref 26.1–32.9)
MCHC: 32.4 g/dL (ref 31.4–35.0)
MCV: 101.9 FL (ref 82–102)
MPV: 9.6 FL (ref 9.4–12.3)
Platelets: 278 10*3/uL (ref 150–450)
RBC: 4.24 M/uL (ref 4.23–5.6)
RDW: 14.1 % (ref 11.9–14.6)
WBC: 15.9 10*3/uL — ABNORMAL HIGH (ref 4.3–11.1)
nRBC: 0 10*3/uL (ref 0.0–0.2)

## 2021-03-29 MED ORDER — DOXYCYCLINE HYCLATE 100 MG PO CAPS
100 MG | Freq: Once | ORAL | Status: AC
Start: 2021-03-29 — End: 2021-03-29
  Administered 2021-03-29: 23:00:00 100 mg via ORAL

## 2021-03-29 MED ORDER — CEPHALEXIN 500 MG PO CAPS
500 MG | ORAL | Status: AC
Start: 2021-03-29 — End: 2021-03-29
  Administered 2021-03-29: 23:00:00 1000 mg via ORAL

## 2021-03-29 MED ORDER — CEPHALEXIN 500 MG PO CAPS
500 MG | ORAL_CAPSULE | Freq: Three times a day (TID) | ORAL | 0 refills | Status: AC
Start: 2021-03-29 — End: 2021-04-05

## 2021-03-29 MED ORDER — DOXYCYCLINE MONOHYDRATE 100 MG PO TABS
100 MG | ORAL_TABLET | Freq: Two times a day (BID) | ORAL | 0 refills | Status: AC
Start: 2021-03-29 — End: 2021-04-05

## 2021-03-29 MED FILL — DOXYCYCLINE HYCLATE 100 MG PO CAPS: 100 MG | ORAL | Qty: 1

## 2021-03-29 MED FILL — CEPHALEXIN 500 MG PO CAPS: 500 MG | ORAL | Qty: 2

## 2021-03-29 NOTE — Discharge Instructions (Addendum)
Return as needed for any questions, concerns or worsening symptoms, particularly rapidly expanding redness, persistent fever/chills, lethargy, ill appearance, recurrent vomiting, increasing/unremitting pain.

## 2021-03-29 NOTE — ED Triage Notes (Signed)
Patient ambulatory to triage with CO red warm area to posterior head near right ear. Hx sepsis with spinal infection in the past. Open area in center of swelling. Reports feeling fatigued and nauseated.

## 2021-03-29 NOTE — ED Notes (Signed)
I have reviewed discharge instructions with the patient.  The patient verbalized understanding.    Patient left ED via Discharge Method: ambulatory to Home.    Opportunity for questions and clarification provided.       Patient given 2 scripts.         To continue your aftercare when you leave the hospital, you may receive an automated call from our care team to check in on how you are doing.  This is a free service and part of our promise to provide the best care and service to meet your aftercare needs.??? If you have questions, or wish to unsubscribe from this service please call [phone].  Thank you for Choosing our [facility] Emergency Department.      Pricilla Riffle, RN  03/29/21 2007

## 2021-03-29 NOTE — ED Provider Notes (Signed)
Emergency Department Provider Note                   PCP:                None Provider               Age: 50 y.o.      Sex: male     Final diagnosis/impression:  1. Cellulitis, neck         Disposition: Discharge    MDM/Clinical Course:  Patient seen by myself here in the [facility] downtown emergency department.  Patient had signs, symptoms and clinical history most consistent with cellulitis symptoms of the posterior/lateral neck, no signs of abscess or fluctuance on my exam.  Labs and radiology ordered, notable for some elevated leukocytosis in the context of chronic steroid use as well as suspected cellulitis.  Neck is freely mobile and I see no signs of areas of abscess on my exam.  Do not feel this is consistent with meningitis as there is an obvious skin infection present.  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  He is afebrile during ER visit and is well-appearing. While under my care, patient ordered and / or received p.o. Keflex and p.o. doxycycline.  Outpatient prescriptions for Keflex and doxycycline written and patient given prescription discounts with good Rx printouts, patient states he can afford these medications that cost roughly 9 and $12 respectively for fill.  Instructed to take doxycycline with food as well as ibuprofen with food.  Recommend ibuprofen for pain/to reduce inflammation.  Patient/family given instructions to return as needed for any questions, concerns or worsening symptoms, particularly those as outlined in the disposition section / discharge section of patient discharge paperwork. Patient/family verbalizes understanding agreement with ED course/plan. Questions answered.    HPI: [name] is a 50 y.o. 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  Patient notes 3 to 4-day history of increased pain, redness and irritation of his posterior/lateral neck.  There are 2 roughly 3 to 4 mm circular lesions that appear is minimally ulcerated lesions, there is surrounding erythema over roughly 4 cm in diameter area of nonindurated but erythematous and painful skin tissue.  No signs of fluctuance or abscess.  Patient denies any recent injection or IV drug use.  Notes some chills this morning but denies objective fever.  No nausea or vomiting symptoms.  No headache or photophobia symptoms.  Patient pain is rated as high moderate to low severe, worse with palpation or irritation.  No neck mobility issues.  No throat pain or dental issues.  No ear pain symptoms.  Patient/family denies any other evaluation for today's acute complaint. Patient/family denies any other aggravating or alleviating factors. Patient/family denies any other symptoms.    ROS:   All review of systems negative except as noted above in the history of the present illness.    Past Medical/ Family/ Social History:     Medical history:   Past Medical History:   Diagnosis Date    COPD (chronic obstructive pulmonary disease) ([place])  Surgical history:   Past Surgical History:   Procedure Laterality Date    BACK SURGERY      herniation l4-l5    HERNIA REPAIR      ORTHOPEDIC SURGERY Right     Ankle fracture repair    ORTHOPEDIC SURGERY Right     Baker's cyst       Family history: No family history on file.    Social history:   Social History     Socioeconomic History    Marital status: Single   Tobacco Use    Smoking status: Every Day     Packs/day: 1.00     Years: 20.00     Pack years: 20.00     Types: Cigarettes    Smokeless tobacco: Never   Substance and Sexual Activity    Alcohol use: Never    Drug use: Never        Medications:   Previous  Medications    HYOSCYAMINE SULFATE SL (LEVSIN/SL) 0.125 MG SUBL    Place 0.125-0.25 mg under the tongue every 6 hours as needed (Pain)    ONDANSETRON (ZOFRAN-ODT) 4 MG DISINTEGRATING TABLET    Take 1 tablet by mouth 3 times daily as needed for Nausea or Vomiting        Allergies:   Allergies   Allergen Reactions    Vancomycin Hives         Physical Exam     Vitals signs reviewed.   Patient Vitals for the past 4 hrs:   Temp   03/29/21 1925 98 ??F (36.7 ??C)       General: Alert and oriented ??4, no acute distress   Eyes: Anicteric, conjunctiva pink, PERRLA, EOMI  ENT: No nasal discharge, no gross nasal congestion present  Pulmonary: Clear to auscultation bilaterally with symmetric chest rise, no increased work of breathing, no accessory muscle use  Cardiovascular: Regular rate and rhythm, no rub or gallop appreciated on my exam  GI: Abdomen is soft, nontender, nondistended, bowel sounds are present  Musculoskeletal: No obvious joint deformity or joint effusion, normal joint range of motion  Neuro: Cranial nerves II through VII grossly intact, strength and sensation is grossly intact in the upper and lower extremities bilaterally, no ataxia, dysarthria or aphasia.  Skin: Skin is warm and dry, right posterior/lateral neck with 2 tiny skin lesions roughly 3 to 4 mm, ulcerated without severe induration but noted surrounding erythema that does not pass the angle of the mandible and spares the anterior sternocleidal mastoid on the right, does not extend posterior/medially such that it abuts the spine.  This area is tender to palpation/sensitive without obvious fluctuance, induration or abscess.      Results for orders placed or performed during the hospital encounter of 03/29/21   CBC   Result Value Ref Range    WBC 15.9 (H) 4.3 - 11.1 K/uL    RBC 4.24 4.23 - 5.6 M/uL    Hemoglobin 14.0 13.6 - 17.2 g/dL    Hematocrit 43.2 41.1 - 50.3 %    MCV 101.9 82 - 102 FL    MCH 33.0 (H) 26.1 - 32.9 PG    MCHC 32.4 31.4 - 35.0 g/dL     RDW 14.1 11.9 - 14.6 %    Platelets 278 150 - 450 K/uL    MPV 9.6 9.4 - 12.3 FL    nRBC 0.00 0.0 - 0.2 K/uL   Comprehensive Metabolic Panel   Result Value Ref Range    Sodium  135 133 - 143 mmol/L    Potassium 4.0 3.5 - 5.1 mmol/L    Chloride 106 101 - 110 mmol/L    CO2 25 21 - 32 mmol/L    Anion Gap 4 2 - 11 mmol/L    Glucose 208 (H) 65 - 100 mg/dL    BUN 13 6 - 23 MG/DL    Creatinine 1.10 0.8 - 1.5 MG/DL    Est, Glom Filt Rate >60 >60 ml/min/1.57m    Calcium 8.3 8.3 - 10.4 MG/DL    Total Bilirubin 0.4 0.2 - 1.1 MG/DL    ALT 45 12 - 65 U/L    AST 40 (H) 15 - 37 U/L    Alk Phosphatase 71 50 - 136 U/L    Total Protein 7.0 6.3 - 8.2 g/dL    Albumin 3.4 (L) 3.5 - 5.0 g/dL    Globulin 3.6 2.8 - 4.5 g/dL    Albumin/Globulin Ratio 0.9 0.4 - 1.6          No orders to display                     Voice dictation software was used during the making of this note.  This software is not perfect and grammatical and other typographical errors may be present.  This note has not been completely proofread for errors.     ADoree Albee MD  03/29/21 [phone]

## 2022-09-04 ENCOUNTER — Inpatient Hospital Stay: Admit: 2022-09-04 | Discharge: 2022-09-04 | Payer: MEDICAID

## 2022-09-04 ENCOUNTER — Emergency Department: Payer: Medicaid Other | Primary: Sports Medicine

## 2022-09-04 DIAGNOSIS — M5412 Radiculopathy, cervical region: Secondary | ICD-10-CM

## 2022-09-04 DIAGNOSIS — M542 Cervicalgia: Secondary | ICD-10-CM

## 2022-09-04 MED ORDER — DEXAMETHASONE SODIUM PHOSPHATE 10 MG/ML IJ SOLN
10 | INTRAMUSCULAR | Status: DC
Start: 2022-09-04 — End: 2022-09-04

## 2022-09-04 MED ORDER — KETOROLAC TROMETHAMINE 30 MG/ML IJ SOLN
30 | INTRAMUSCULAR | Status: DC
Start: 2022-09-04 — End: 2022-09-04

## 2022-09-04 MED FILL — DEXAMETHASONE SODIUM PHOSPHATE 10 MG/ML IJ SOLN: 10 MG/ML | INTRAMUSCULAR | Qty: 1

## 2022-09-04 MED FILL — KETOROLAC TROMETHAMINE 30 MG/ML IJ SOLN: 30 MG/ML | INTRAMUSCULAR | Qty: 1

## 2022-09-04 NOTE — ED Notes (Signed)
Unable to location patient.

## 2022-09-04 NOTE — ED Notes (Signed)
Unable to locate patient, assume patient eloped.
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
